# Patient Record
Sex: Female | Born: 1975 | Race: Black or African American | Hispanic: No | Marital: Married | Smoking: Never smoker
Health system: Southern US, Community
[De-identification: ages and names within clinical notes are randomized; demographics above are authoritative.]

## PROBLEM LIST (undated history)

## (undated) DIAGNOSIS — M79601 Pain in right arm: Secondary | ICD-10-CM

## (undated) DIAGNOSIS — Z8742 Personal history of other diseases of the female genital tract: Secondary | ICD-10-CM

## (undated) DIAGNOSIS — O24419 Gestational diabetes mellitus in pregnancy, unspecified control: Secondary | ICD-10-CM

## (undated) DIAGNOSIS — K219 Gastro-esophageal reflux disease without esophagitis: Secondary | ICD-10-CM

## (undated) DIAGNOSIS — R079 Chest pain, unspecified: Secondary | ICD-10-CM

## (undated) DIAGNOSIS — M7751 Other enthesopathy of right foot: Secondary | ICD-10-CM

## (undated) DIAGNOSIS — M79606 Pain in leg, unspecified: Secondary | ICD-10-CM

## (undated) DIAGNOSIS — N814 Uterovaginal prolapse, unspecified: Secondary | ICD-10-CM

## (undated) DIAGNOSIS — R5383 Other fatigue: Secondary | ICD-10-CM

## (undated) DIAGNOSIS — R9439 Abnormal result of other cardiovascular function study: Secondary | ICD-10-CM

## (undated) DIAGNOSIS — M21619 Bunion of unspecified foot: Secondary | ICD-10-CM

## (undated) HISTORY — DX: Gestational diabetes mellitus in pregnancy, unspecified control: O24.419

## (undated) HISTORY — DX: Pain in right arm: M79.601

## (undated) HISTORY — DX: Gastro-esophageal reflux disease without esophagitis: K21.9

## (undated) HISTORY — DX: Abnormal result of other cardiovascular function study: R94.39

## (undated) HISTORY — DX: Bunion of unspecified foot: M21.619

## (undated) HISTORY — PX: OTHER SURGICAL HISTORY: SHX169

## (undated) HISTORY — DX: Chest pain, unspecified: R07.9

## (undated) HISTORY — DX: Uterovaginal prolapse, unspecified: N81.4

## (undated) HISTORY — DX: Pain in leg, unspecified: M79.606

## (undated) HISTORY — DX: Other enthesopathy of right foot and ankle: M77.51

## (undated) HISTORY — DX: Personal history of other diseases of the female genital tract: Z87.42

## (undated) HISTORY — DX: Other fatigue: R53.83

---

## 1997-07-18 ENCOUNTER — Emergency Department (HOSPITAL_COMMUNITY): Admission: EM | Admit: 1997-07-18 | Discharge: 1997-07-18 | Payer: Self-pay | Admitting: Emergency Medicine

## 1998-10-01 ENCOUNTER — Emergency Department (HOSPITAL_COMMUNITY): Admission: EM | Admit: 1998-10-01 | Discharge: 1998-10-01 | Payer: Self-pay | Admitting: Emergency Medicine

## 1999-12-17 ENCOUNTER — Encounter: Payer: Self-pay | Admitting: Emergency Medicine

## 1999-12-17 ENCOUNTER — Emergency Department (HOSPITAL_COMMUNITY): Admission: EM | Admit: 1999-12-17 | Discharge: 1999-12-17 | Payer: Self-pay | Admitting: Emergency Medicine

## 2001-02-16 HISTORY — PX: DIAGNOSTIC LAPAROSCOPY: SUR761

## 2007-08-01 ENCOUNTER — Emergency Department (HOSPITAL_BASED_OUTPATIENT_CLINIC_OR_DEPARTMENT_OTHER): Admission: EM | Admit: 2007-08-01 | Discharge: 2007-08-01 | Payer: Self-pay | Admitting: Emergency Medicine

## 2008-03-21 ENCOUNTER — Emergency Department (HOSPITAL_BASED_OUTPATIENT_CLINIC_OR_DEPARTMENT_OTHER): Admission: EM | Admit: 2008-03-21 | Discharge: 2008-03-22 | Payer: Self-pay | Admitting: Emergency Medicine

## 2008-03-22 ENCOUNTER — Ambulatory Visit (HOSPITAL_COMMUNITY): Admission: RE | Admit: 2008-03-22 | Discharge: 2008-03-22 | Payer: Self-pay | Admitting: Obstetrics and Gynecology

## 2008-07-10 ENCOUNTER — Encounter: Admission: RE | Admit: 2008-07-10 | Discharge: 2008-07-10 | Payer: Self-pay | Admitting: Obstetrics and Gynecology

## 2008-10-12 ENCOUNTER — Inpatient Hospital Stay (HOSPITAL_COMMUNITY): Admission: AD | Admit: 2008-10-12 | Discharge: 2008-10-14 | Payer: Self-pay | Admitting: Obstetrics and Gynecology

## 2008-11-21 ENCOUNTER — Inpatient Hospital Stay (HOSPITAL_COMMUNITY): Admission: RE | Admit: 2008-11-21 | Discharge: 2008-11-24 | Payer: Self-pay | Admitting: Obstetrics and Gynecology

## 2010-05-22 LAB — CBC
HCT: 36.6 % (ref 36.0–46.0)
Hemoglobin: 12.2 g/dL (ref 12.0–15.0)
MCHC: 33.3 g/dL (ref 30.0–36.0)
MCHC: 33.7 g/dL (ref 30.0–36.0)
MCV: 92.4 fL (ref 78.0–100.0)
Platelets: 127 10*3/uL — ABNORMAL LOW (ref 150–400)
Platelets: 155 10*3/uL (ref 150–400)
RBC: 3.96 MIL/uL (ref 3.87–5.11)
RDW: 14.6 % (ref 11.5–15.5)
RDW: 14.8 % (ref 11.5–15.5)
WBC: 10.1 10*3/uL (ref 4.0–10.5)

## 2010-05-22 LAB — GLUCOSE, CAPILLARY: Glucose-Capillary: 77 mg/dL (ref 70–99)

## 2010-05-22 LAB — COMPREHENSIVE METABOLIC PANEL
ALT: 14 U/L (ref 0–35)
AST: 15 U/L (ref 0–37)
Albumin: 2.6 g/dL — ABNORMAL LOW (ref 3.5–5.2)
Alkaline Phosphatase: 186 U/L — ABNORMAL HIGH (ref 39–117)
BUN: 6 mg/dL (ref 6–23)
CO2: 23 mEq/L (ref 19–32)
Calcium: 8.9 mg/dL (ref 8.4–10.5)
Chloride: 107 mEq/L (ref 96–112)
Creatinine, Ser: 0.62 mg/dL (ref 0.4–1.2)
GFR calc Af Amer: >60 mL/min (ref >60)
GFR calc non Af Amer: >60 mL/min (ref >60)
Glucose, Bld: 151 mg/dL — ABNORMAL HIGH (ref 70–99)
Potassium: 3.5 mEq/L (ref 3.5–5.1)
Sodium: 137 mEq/L (ref 135–145)
Total Bilirubin: 0.4 mg/dL (ref 0.3–1.2)
Total Protein: 5.3 g/dL — ABNORMAL LOW (ref 6.0–8.3)

## 2010-05-22 LAB — BASIC METABOLIC PANEL
BUN: 7 mg/dL (ref 6–23)
Calcium: 8.3 mg/dL — ABNORMAL LOW (ref 8.4–10.5)
Creatinine, Ser: 0.7 mg/dL (ref 0.4–1.2)
GFR calc non Af Amer: >60 mL/min (ref >60)
Glucose, Bld: 80 mg/dL (ref 70–99)
Sodium: 134 mEq/L — ABNORMAL LOW (ref 135–145)

## 2010-05-22 LAB — RPR: RPR Ser Ql: NONREACTIVE

## 2010-05-24 LAB — GLUCOSE, CAPILLARY
Glucose-Capillary: 115 mg/dL — ABNORMAL HIGH (ref 70–99)
Glucose-Capillary: 116 mg/dL — ABNORMAL HIGH (ref 70–99)
Glucose-Capillary: 123 mg/dL — ABNORMAL HIGH (ref 70–99)

## 2010-06-03 LAB — DIFFERENTIAL
Basophils Absolute: 0.1 10*3/uL (ref 0.0–0.1)
Basophils Relative: 1 % (ref 0–1)
Eosinophils Absolute: 0.2 10*3/uL (ref 0.0–0.7)
Eosinophils Relative: 2 % (ref 0–5)
Lymphocytes Relative: 20 % (ref 12–46)
Monocytes Absolute: 0.7 10*3/uL (ref 0.1–1.0)

## 2010-06-03 LAB — CBC
HCT: 40.3 % (ref 36.0–46.0)
Hemoglobin: 13.6 g/dL (ref 12.0–15.0)
MCHC: 33.8 g/dL (ref 30.0–36.0)
Platelets: 200 10*3/uL (ref 150–400)
RDW: 13 % (ref 11.5–15.5)

## 2010-06-03 LAB — HCG, QUANTITATIVE, PREGNANCY: hCG, Beta Chain, Quant, S: 142 m[IU]/mL — ABNORMAL HIGH (ref <5)

## 2010-06-03 LAB — ABO/RH: ABO/RH(D): O POS

## 2010-06-03 LAB — URINALYSIS, ROUTINE W REFLEX MICROSCOPIC
Bilirubin Urine: NEGATIVE
Hgb urine dipstick: NEGATIVE
Ketones, ur: NEGATIVE mg/dL
Nitrite: NEGATIVE
Urobilinogen, UA: 1 mg/dL (ref 0.0–1.0)
pH: 6 (ref 5.0–8.0)

## 2010-06-03 LAB — BASIC METABOLIC PANEL
BUN: 18 mg/dL (ref 6–23)
CO2: 25 mEq/L (ref 19–32)
GFR calc non Af Amer: >60 mL/min (ref >60)
Glucose, Bld: 88 mg/dL (ref 70–99)
Potassium: 4.4 mEq/L (ref 3.5–5.1)
Sodium: 138 mEq/L (ref 135–145)

## 2010-06-03 LAB — PREGNANCY, URINE: Preg Test, Ur: POSITIVE

## 2010-06-03 LAB — GC/CHLAMYDIA PROBE AMP, GENITAL: Chlamydia, DNA Probe: NEGATIVE

## 2011-04-28 ENCOUNTER — Other Ambulatory Visit: Payer: Self-pay | Admitting: Urology

## 2011-05-20 ENCOUNTER — Encounter (HOSPITAL_COMMUNITY): Payer: Self-pay | Admitting: Pharmacist

## 2011-06-08 ENCOUNTER — Inpatient Hospital Stay (HOSPITAL_COMMUNITY): Admission: RE | Admit: 2011-06-08 | Payer: BC Managed Care – PPO | Source: Ambulatory Visit

## 2011-06-09 ENCOUNTER — Other Ambulatory Visit: Payer: Self-pay | Admitting: Obstetrics and Gynecology

## 2011-06-10 ENCOUNTER — Encounter (HOSPITAL_COMMUNITY)
Admission: RE | Admit: 2011-06-10 | Discharge: 2011-06-10 | Disposition: A | Discharge status: Home / Self Care | Payer: BC Managed Care – PPO | Source: Ambulatory Visit | Attending: Obstetrics and Gynecology | Admitting: Obstetrics and Gynecology

## 2011-06-10 ENCOUNTER — Encounter (HOSPITAL_COMMUNITY): Payer: Self-pay

## 2011-06-10 LAB — CBC
Hemoglobin: 11.1 g/dL — ABNORMAL LOW (ref 12.0–15.0)
Platelets: 267 10*3/uL (ref 150–400)
RBC: 4.21 MIL/uL (ref 3.87–5.11)
WBC: 6.8 10*3/uL (ref 4.0–10.5)

## 2011-06-10 LAB — APTT: aPTT: 30 seconds (ref 24–37)

## 2011-06-10 LAB — SURGICAL PCR SCREEN: Staphylococcus aureus: NEGATIVE

## 2011-06-10 NOTE — Patient Instructions (Addendum)
20 Chandell A Steinhauser  06/10/2011   Your procedure is scheduled on:  5/2  Enter through the Main Entrance of [facility] at 6 AM.  Pick up the phone at the desk and dial [phone].   Call this number if you have problems the morning of surgery: [phone]   Remember:   Do not eat food:After Midnight.  Do not drink clear liquids: After Midnight.  Take these medicines the morning of surgery with A SIP OF WATER: NA   Do not wear jewelry, make-up or nail polish.  Do not wear lotions, powders, or perfumes. You may wear deodorant.  Do not shave 48 hours prior to surgery.  Do not bring valuables to the hospital.  Contacts, dentures or bridgework may not be worn into surgery.  Leave suitcase in the car. After surgery it may be brought to your room.  For patients admitted to the hospital, checkout time is 11:00 AM the day of discharge.   Patients discharged the day of surgery will not be allowed to drive home.  Name and phone number of your driver: NA  Special Instructions: CHG Shower Use Special Wash: 1/2 bottle night before surgery and 1/2 bottle morning of surgery.   Please read over the following fact sheets that you were given: MRSA Information

## 2011-06-17 MED ORDER — GENTAMICIN SULFATE 40 MG/ML IJ SOLN
INTRAVENOUS | Status: AC
  Administered 2011-06-18: 100 mL via INTRAVENOUS
  Filled 2011-06-17: qty 2.5

## 2011-06-17 NOTE — H&P (Signed)
History of Present Illness   [name] has uterine prolapse. Her support anteriorly and posteriorly actually looked quite good. She has an urgent bladder and mild frequency and nocturia. She has endometriosis and fibroids and is planning a hysterectomy with Dr. Cherly Hensen. She can feel her dropped uterus.   She was here to discuss her urodynamics. Review of systems: No change in bowel or neurologic status.   Uroflowmetry: She voided 247 mL with a maximum flow of 45 mL per second. Pattern was normal. Residual was 7 mL.   Her maximum capacity was 325 mL. She had instability reaching pressures of 66 cm of water associated with no leakage. She did not leak generating a Valsalva pressure of 114 cm of water. During voluntary voiding she voided 446 mL with a maximum voiding pressure of 42 cm of water and she voided efficiently. EMG activity was normal. She had a modest cystocele fluoroscopically. Her bladder was trabeculated. She had a high pressure stable contraction after voiding. The details of the urodynamics are signed and dictated on the urodynamic sheet.    Past Medical History Problems  1. History of  Asthma 493.90 2. History of  Endometriosis 617.9 3. History of  Esophageal Reflux 530.81 4. History of  Gestational Diabetes Mellitus 648.80 5. History of  Uterine Prolapse 618.1  Surgical History Problems  1. History of  Cesarean Section 2. History of  Laparoscopy Small Intestine Excision Endometriotic Tissue  Current Meds 1. Bee Pollen 500 MG Oral Tablet Chewable; Therapy: (Recorded:01Nov2012) to  Allergies Medication  1. Penicillins  Family History Problems  1. Maternal history of  Bone Cancer lung, liver 2. Paternal history of  Prostate Cancer V16.42 3. Paternal grandfather's history of  Renal Failure  Social History Problems  1. Caffeine Use 2. Marital History - Currently Married 3. 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 History of  Alcohol  Use  Assessment Assessed  1. Cystocele 596.89 2. Uterovaginal Prolapse 618.4 3. Feelings Of Urinary Urgency 788.63  Discussion/Summary   [name] has uterine prolapse and a cystocele. She likely would benefit from a transvaginal hysterectomy and fixation of the cuff to the uterosacral ligaments plus or minus cystocele repair plus or minus 4-corner graft and it is most likely she would need a vault suspension. She was consented for this.   I drew her a picture and we talked about prolapse surgery in detail. Pros, cons, general surgical and anesthetic risks, and other options including behavioral therapy, pessaries, and watchful waiting were discussed. She understands that prolapse repairs are successful in 80-85% of cases for prolapse symptoms and can recur anteriorly, posteriorly, and/or apically. She understands that in most cases I use a graft and general risks were discussed. Surgical risks were described but not limited to the discussion of injury to neighboring structures including the bowel (with possible life-threatening sepsis and colostomy), bladder, urethra, vagina (all resulting in further surgery), and ureter (resulting in re-implantation). We talked about injury to nerves/soft tissue leading to debilitating and intractable pelvic, abdominal, and lower extremity pain syndromes and neuropathies. The risks of buttock pain, intractable dyspareunia, and vaginal narrowing and shortening with sequelae were discussed. Bleeding risks, transfusion rates, and infection were discussed. The risk of persistent, de novo, or worsening bladder and/or bowel incontinence/dysfunction was discussed. The need for CIC was described as well the usual post-operative course. The patient understands that she might not reach her treatment goal and that she might be worse following surgery.  Watchful waiting and pessary  were also discussed.   [name] is going to follow up with Dr. Maxie Better. We will  proceed accordingly. She was happy with our discussion today. I will await a call from Dr. Cherly Hensen to schedule her surgery.  After a thorough review of the management options for the patient's condition the patient  elected to proceed with surgical therapy as noted above. We have discussed the potential benefits and risks of the procedure, side effects of the proposed treatment, the likelihood of the patient achieving the goals of the procedure, and any potential problems that might occur during the procedure or recuperation. Informed consent has been obtained.

## 2011-06-18 ENCOUNTER — Encounter (HOSPITAL_COMMUNITY): Payer: Self-pay | Admitting: Anesthesiology

## 2011-06-18 ENCOUNTER — Encounter (HOSPITAL_COMMUNITY)
Admission: RE | Disposition: A | Discharge status: Home / Self Care | Payer: Self-pay | Source: Ambulatory Visit | Attending: Obstetrics and Gynecology

## 2011-06-18 ENCOUNTER — Ambulatory Visit (HOSPITAL_COMMUNITY)
Admission: RE | Admit: 2011-06-18 | Discharge: 2011-06-19 | Disposition: A | Discharge status: Home / Self Care | Payer: BC Managed Care – PPO | Source: Ambulatory Visit | Attending: Obstetrics and Gynecology | Admitting: Obstetrics and Gynecology

## 2011-06-18 ENCOUNTER — Encounter (HOSPITAL_COMMUNITY): Payer: Self-pay | Admitting: *Deleted

## 2011-06-18 ENCOUNTER — Ambulatory Visit (HOSPITAL_COMMUNITY): Payer: BC Managed Care – PPO | Admitting: Anesthesiology

## 2011-06-18 DIAGNOSIS — Z01818 Encounter for other preprocedural examination: Secondary | ICD-10-CM | POA: Insufficient documentation

## 2011-06-18 DIAGNOSIS — Z90710 Acquired absence of both cervix and uterus: Secondary | ICD-10-CM

## 2011-06-18 DIAGNOSIS — N815 Vaginal enterocele: Secondary | ICD-10-CM | POA: Insufficient documentation

## 2011-06-18 DIAGNOSIS — N8000 Endometriosis of the uterus, unspecified: Secondary | ICD-10-CM | POA: Insufficient documentation

## 2011-06-18 DIAGNOSIS — Z01812 Encounter for preprocedural laboratory examination: Secondary | ICD-10-CM | POA: Insufficient documentation

## 2011-06-18 DIAGNOSIS — N812 Incomplete uterovaginal prolapse: Secondary | ICD-10-CM | POA: Insufficient documentation

## 2011-06-18 DIAGNOSIS — D251 Intramural leiomyoma of uterus: Secondary | ICD-10-CM | POA: Insufficient documentation

## 2011-06-18 HISTORY — PX: VAGINAL HYSTERECTOMY: SHX2639

## 2011-06-18 HISTORY — PX: CYSTOSCOPY: SHX5120

## 2011-06-18 LAB — BASIC METABOLIC PANEL
CO2: 25 mEq/L (ref 19–32)
Chloride: 105 mEq/L (ref 96–112)
Creatinine, Ser: 0.81 mg/dL (ref 0.50–1.10)
Potassium: 4 mEq/L (ref 3.5–5.1)

## 2011-06-18 LAB — TYPE AND SCREEN: Antibody Screen: NEGATIVE

## 2011-06-18 LAB — PREGNANCY, URINE: Preg Test, Ur: NEGATIVE

## 2011-06-18 SURGERY — HYSTERECTOMY, VAGINAL
Anesthesia: General | Wound class: Clean Contaminated

## 2011-06-18 MED ORDER — MIDAZOLAM HCL 5 MG/5ML IJ SOLN
INTRAMUSCULAR | Status: DC | PRN
  Administered 2011-06-18: 2 mg via INTRAVENOUS

## 2011-06-18 MED ORDER — ZOLPIDEM TARTRATE 5 MG PO TABS: 5.0000 mg | ORAL_TABLET | Freq: Every evening | ORAL | Status: DC | PRN

## 2011-06-18 MED ORDER — FENTANYL CITRATE 0.05 MG/ML IJ SOLN
INTRAMUSCULAR | Status: AC
  Filled 2011-06-18: qty 2

## 2011-06-18 MED ORDER — HYDROMORPHONE HCL PF 1 MG/ML IJ SOLN
INTRAMUSCULAR | Status: DC | PRN
  Administered 2011-06-18 (×2): 0.5 mg via INTRAVENOUS

## 2011-06-18 MED ORDER — FENTANYL CITRATE 0.05 MG/ML IJ SOLN
INTRAMUSCULAR | Status: DC | PRN
  Administered 2011-06-18: 150 ug via INTRAVENOUS
  Administered 2011-06-18: 100 ug via INTRAVENOUS
  Administered 2011-06-18 (×2): 50 ug via INTRAVENOUS

## 2011-06-18 MED ORDER — MIDAZOLAM HCL 2 MG/2ML IJ SOLN
INTRAMUSCULAR | Status: AC
  Filled 2011-06-18: qty 2

## 2011-06-18 MED ORDER — IBUPROFEN 800 MG PO TABS: 800.0000 mg | ORAL_TABLET | Freq: Three times a day (TID) | ORAL | Status: DC | PRN

## 2011-06-18 MED ORDER — ONDANSETRON HCL 4 MG/2ML IJ SOLN
INTRAMUSCULAR | Status: DC | PRN
  Administered 2011-06-18: 4 mg via INTRAVENOUS

## 2011-06-18 MED ORDER — DIPHENHYDRAMINE HCL 50 MG/ML IJ SOLN: 12.5000 mg | Freq: Four times a day (QID) | INTRAMUSCULAR | Status: DC | PRN

## 2011-06-18 MED ORDER — ONDANSETRON HCL 4 MG PO TABS: 4.0000 mg | ORAL_TABLET | Freq: Four times a day (QID) | ORAL | Status: DC | PRN

## 2011-06-18 MED ORDER — EPHEDRINE 5 MG/ML INJ
INTRAVENOUS | Status: AC
  Filled 2011-06-18: qty 10

## 2011-06-18 MED ORDER — ROCURONIUM BROMIDE 50 MG/5ML IV SOLN
INTRAVENOUS | Status: AC
  Filled 2011-06-18: qty 1

## 2011-06-18 MED ORDER — VASOPRESSIN 20 UNIT/ML IJ SOLN
INTRAMUSCULAR | Status: AC
  Filled 2011-06-18: qty 3

## 2011-06-18 MED ORDER — INDIGOTINDISULFONATE SODIUM 8 MG/ML IJ SOLN
INTRAMUSCULAR | Status: DC | PRN
  Administered 2011-06-18: 5 mL via INTRAVENOUS

## 2011-06-18 MED ORDER — FENTANYL CITRATE 0.05 MG/ML IJ SOLN
25.0000 ug | INTRAMUSCULAR | Status: DC | PRN
  Administered 2011-06-18 (×2): 25 ug via INTRAVENOUS

## 2011-06-18 MED ORDER — GLYCOPYRROLATE 0.2 MG/ML IJ SOLN
INTRAMUSCULAR | Status: AC
  Filled 2011-06-18: qty 1

## 2011-06-18 MED ORDER — PROMETHAZINE HCL 25 MG/ML IJ SOLN: 6.2500 mg | INTRAMUSCULAR | Status: DC | PRN

## 2011-06-18 MED ORDER — GLYCOPYRROLATE 0.2 MG/ML IJ SOLN
INTRAMUSCULAR | Status: DC | PRN
  Administered 2011-06-18: 0.6 mg via INTRAVENOUS
  Administered 2011-06-18: 0.2 mg via INTRAVENOUS

## 2011-06-18 MED ORDER — INDIGOTINDISULFONATE SODIUM 8 MG/ML IJ SOLN
INTRAMUSCULAR | Status: AC
  Filled 2011-06-18: qty 5

## 2011-06-18 MED ORDER — ONDANSETRON HCL 4 MG/2ML IJ SOLN
INTRAMUSCULAR | Status: AC
  Filled 2011-06-18: qty 2

## 2011-06-18 MED ORDER — HYDROMORPHONE HCL PF 1 MG/ML IJ SOLN
INTRAMUSCULAR | Status: AC
  Filled 2011-06-18: qty 1

## 2011-06-18 MED ORDER — EPHEDRINE SULFATE 50 MG/ML IJ SOLN
INTRAMUSCULAR | Status: DC | PRN
  Administered 2011-06-18 (×2): 10 mg via INTRAVENOUS

## 2011-06-18 MED ORDER — GENTAMICIN SULFATE 40 MG/ML IJ SOLN
Freq: Three times a day (TID) | INTRAVENOUS | Status: AC
  Administered 2011-06-18 (×2): via INTRAVENOUS
  Filled 2011-06-18 (×2): qty 2.5

## 2011-06-18 MED ORDER — DEXTROSE IN LACTATED RINGERS 5 % IV SOLN
INTRAVENOUS | Status: DC
  Administered 2011-06-18 – 2011-06-19 (×3): via INTRAVENOUS

## 2011-06-18 MED ORDER — PROPOFOL 10 MG/ML IV EMUL
INTRAVENOUS | Status: DC | PRN
  Administered 2011-06-18: 200 mg via INTRAVENOUS

## 2011-06-18 MED ORDER — HYDROMORPHONE 0.3 MG/ML IV SOLN
INTRAVENOUS | Status: DC
  Administered 2011-06-18: 12:00:00 via INTRAVENOUS
  Administered 2011-06-18: 0.4 mg via INTRAVENOUS
  Administered 2011-06-19: 0.599 mg via INTRAVENOUS
  Filled 2011-06-18: qty 25

## 2011-06-18 MED ORDER — DIPHENHYDRAMINE HCL 12.5 MG/5ML PO ELIX
12.5000 mg | ORAL_SOLUTION | Freq: Four times a day (QID) | ORAL | Status: DC | PRN
  Filled 2011-06-18: qty 5

## 2011-06-18 MED ORDER — ONDANSETRON HCL 4 MG/2ML IJ SOLN: 4.0000 mg | Freq: Four times a day (QID) | INTRAMUSCULAR | Status: DC | PRN

## 2011-06-18 MED ORDER — VASOPRESSIN 20 UNIT/ML IJ SOLN
INTRAMUSCULAR | Status: DC | PRN
  Administered 2011-06-18: 20 [IU]

## 2011-06-18 MED ORDER — OXYCODONE-ACETAMINOPHEN 5-325 MG PO TABS
1.0000 | ORAL_TABLET | ORAL | Status: DC | PRN
  Administered 2011-06-19: 2 via ORAL
  Filled 2011-06-18: qty 2

## 2011-06-18 MED ORDER — LIDOCAINE-EPINEPHRINE (PF) 1 %-1:200000 IJ SOLN
INTRAMUSCULAR | Status: AC
  Filled 2011-06-18: qty 10

## 2011-06-18 MED ORDER — ROCURONIUM BROMIDE 100 MG/10ML IV SOLN
INTRAVENOUS | Status: DC | PRN
  Administered 2011-06-18 (×2): 10 mg via INTRAVENOUS
  Administered 2011-06-18: 50 mg via INTRAVENOUS

## 2011-06-18 MED ORDER — MEPERIDINE HCL 25 MG/ML IJ SOLN: 6.2500 mg | INTRAMUSCULAR | Status: DC | PRN

## 2011-06-18 MED ORDER — NALOXONE HCL 0.4 MG/ML IJ SOLN: 0.4000 mg | INTRAMUSCULAR | Status: DC | PRN

## 2011-06-18 MED ORDER — KETOROLAC TROMETHAMINE 30 MG/ML IJ SOLN: 15.0000 mg | Freq: Once | INTRAMUSCULAR | Status: DC | PRN

## 2011-06-18 MED ORDER — FENTANYL CITRATE 0.05 MG/ML IJ SOLN
INTRAMUSCULAR | Status: AC
  Filled 2011-06-18: qty 5

## 2011-06-18 MED ORDER — LACTATED RINGERS IV SOLN
INTRAVENOUS | Status: DC
  Administered 2011-06-18 (×3): via INTRAVENOUS

## 2011-06-18 MED ORDER — NEOSTIGMINE METHYLSULFATE 1 MG/ML IJ SOLN
INTRAMUSCULAR | Status: AC
  Filled 2011-06-18: qty 10

## 2011-06-18 MED ORDER — MIDAZOLAM HCL 2 MG/2ML IJ SOLN: 0.5000 mg | Freq: Once | INTRAMUSCULAR | Status: DC | PRN

## 2011-06-18 MED ORDER — ESTRADIOL 0.1 MG/GM VA CREA
TOPICAL_CREAM | VAGINAL | Status: AC
  Filled 2011-06-18: qty 42.5

## 2011-06-18 MED ORDER — PANTOPRAZOLE SODIUM 40 MG PO TBEC
40.0000 mg | DELAYED_RELEASE_TABLET | Freq: Every day | ORAL | Status: DC
  Administered 2011-06-18: 40 mg via ORAL
  Filled 2011-06-18 (×3): qty 1

## 2011-06-18 MED ORDER — SODIUM CHLORIDE 0.9 % IR SOLN
Freq: Once | Status: DC
  Filled 2011-06-18: qty 1

## 2011-06-18 MED ORDER — MENTHOL 3 MG MT LOZG: 1.0000 | LOZENGE | OROMUCOSAL | Status: DC | PRN

## 2011-06-18 MED ORDER — NEOSTIGMINE METHYLSULFATE 1 MG/ML IJ SOLN
INTRAMUSCULAR | Status: DC | PRN
  Administered 2011-06-18: 3 mg via INTRAVENOUS

## 2011-06-18 MED ORDER — SODIUM CHLORIDE 0.9 % IJ SOLN
INTRAMUSCULAR | Status: DC | PRN
  Administered 2011-06-18: 30 mL via INTRAVENOUS

## 2011-06-18 MED ORDER — ACETAMINOPHEN 325 MG PO TABS: 325.0000 mg | ORAL_TABLET | ORAL | Status: DC | PRN

## 2011-06-18 MED ORDER — LIDOCAINE HCL (CARDIAC) 20 MG/ML IV SOLN
INTRAVENOUS | Status: AC
  Filled 2011-06-18: qty 5

## 2011-06-18 MED ORDER — SODIUM CHLORIDE 0.9 % IJ SOLN: 9.0000 mL | INTRAMUSCULAR | Status: DC | PRN

## 2011-06-18 MED ORDER — SODIUM CHLORIDE 0.9 % IR SOLN: 1.0000 "application " | Freq: Once | Status: DC

## 2011-06-18 MED ORDER — LIDOCAINE HCL (CARDIAC) 20 MG/ML IV SOLN
INTRAVENOUS | Status: DC | PRN
  Administered 2011-06-18 (×2): 50 mg via INTRAVENOUS

## 2011-06-18 MED ORDER — PROPOFOL 10 MG/ML IV EMUL
INTRAVENOUS | Status: AC
  Filled 2011-06-18: qty 20

## 2011-06-18 MED ORDER — HYDROMORPHONE HCL PF 1 MG/ML IJ SOLN: 0.2000 mg | INTRAMUSCULAR | Status: DC | PRN

## 2011-06-18 MED ORDER — BUPIVACAINE HCL (PF) 0.25 % IJ SOLN
INTRAMUSCULAR | Status: AC
  Filled 2011-06-18: qty 30

## 2011-06-18 MED ORDER — ONDANSETRON HCL 4 MG/2ML IJ SOLN
4.0000 mg | Freq: Four times a day (QID) | INTRAMUSCULAR | Status: DC | PRN
  Administered 2011-06-18: 4 mg via INTRAVENOUS
  Filled 2011-06-18: qty 2

## 2011-06-18 MED ORDER — 0.9 % SODIUM CHLORIDE (POUR BTL) OPTIME
TOPICAL | Status: DC | PRN
  Administered 2011-06-18: 1000 mL

## 2011-06-18 MED ORDER — FENTANYL CITRATE 0.05 MG/ML IJ SOLN
INTRAMUSCULAR | Status: AC
  Administered 2011-06-18: 25 ug via INTRAVENOUS
  Filled 2011-06-18: qty 2

## 2011-06-18 MED ORDER — SODIUM CHLORIDE 0.9 % IR SOLN
Status: DC | PRN
  Administered 2011-06-18: 09:00:00

## 2011-06-18 MED ORDER — STERILE WATER FOR IRRIGATION IR SOLN
Status: DC | PRN
  Administered 2011-06-18: 300 mL

## 2011-06-18 SURGICAL SUPPLY — 92 items
ADH SKN CLS APL DERMABOND .7 (GAUZE/BANDAGES/DRESSINGS) ×2
APPLICATOR COTTON TIP 6IN STRL (MISCELLANEOUS) ×2 IMPLANT
BLADE SURG 15 STRL LF C SS BP (BLADE) ×2 IMPLANT
BLADE SURG 15 STRL SS (BLADE) ×3
CABLE HIGH FREQUENCY MONO STRZ (ELECTRODE) IMPLANT
CANISTER SUCTION 2500CC (MISCELLANEOUS) ×6 IMPLANT
CATH FOLEY 2WAY SLVR  5CC 16FR (CATHETERS)
CATH FOLEY 2WAY SLVR 5CC 16FR (CATHETERS) IMPLANT
CATH ROBINSON RED A/P 16FR (CATHETERS) IMPLANT
CLOTH BEACON ORANGE TIMEOUT ST (SAFETY) ×3 IMPLANT
CONT PATH 16OZ SNAP LID 3702 (MISCELLANEOUS) IMPLANT
CONTAINER PREFILL 10% NBF 60ML (FORM) IMPLANT
COVER TABLE BACK 60X90 (DRAPES) ×3 IMPLANT
DECANTER SPIKE VIAL GLASS SM (MISCELLANEOUS) IMPLANT
DERMABOND ADVANCED (GAUZE/BANDAGES/DRESSINGS) ×1
DERMABOND ADVANCED .7 DNX12 (GAUZE/BANDAGES/DRESSINGS) ×2 IMPLANT
DEVICE CAPIO SUTURING (INSTRUMENTS)
DEVICE CAPIO SUTURING OPC (INSTRUMENTS) IMPLANT
DRAIN PENROSE 1/4X12 LTX (DRAIN) ×3 IMPLANT
DRAPE HYSTEROSCOPY (DRAPE) ×3 IMPLANT
DRAPE STERI URO 9X17 APER PCH (DRAPES) ×3 IMPLANT
DRAPE UNDERBUTTOCKS STRL (DRAPE) ×3 IMPLANT
ELECT LIGASURE LONG (ELECTRODE) ×3 IMPLANT
ELECT REM PT RETURN 9FT ADLT (ELECTROSURGICAL)
ELECTRODE REM PT RTRN 9FT ADLT (ELECTROSURGICAL) IMPLANT
FORCEPS CUTTING 33CM 5MM (CUTTING FORCEPS) IMPLANT
GAUZE PACKING 2X5 YD STERILE (GAUZE/BANDAGES/DRESSINGS) IMPLANT
GAUZE SPONGE 4X4 16PLY XRAY LF (GAUZE/BANDAGES/DRESSINGS) ×6 IMPLANT
GLOVE BIO SURGEON STRL SZ 6.5 (GLOVE) ×6 IMPLANT
GLOVE BIO SURGEON STRL SZ7.5 (GLOVE) ×6 IMPLANT
GLOVE BIO SURGEON STRL SZ8 (GLOVE) ×3 IMPLANT
GLOVE BIOGEL PI IND STRL 6.5 (GLOVE) ×2 IMPLANT
GLOVE BIOGEL PI IND STRL 7.0 (GLOVE) ×4 IMPLANT
GLOVE BIOGEL PI INDICATOR 6.5 (GLOVE) ×2
GLOVE BIOGEL PI INDICATOR 7.0 (GLOVE) ×2
GLOVE ECLIPSE 6.0 STRL STRAW (GLOVE) ×1 IMPLANT
GLOVE SURG SS PI 7.0 STRL IVOR (GLOVE) ×1 IMPLANT
GLOVE SURG SS PI 7.5 STRL IVOR (GLOVE) ×1 IMPLANT
GOWN PREVENTION PLUS LG XLONG (DISPOSABLE) ×16 IMPLANT
GOWN STRL REIN XL XLG (GOWN DISPOSABLE) ×5 IMPLANT
GOWN SURGICAL LARGE (GOWNS) ×1 IMPLANT
NDL INSUFFLATION 14GA 120MM (NEEDLE) ×2 IMPLANT
NDL MAYO 6 CRC TAPER PT (NEEDLE) IMPLANT
NDL SPNL 22GX1.5 QUINCKE BK (NEEDLE) ×2 IMPLANT
NDL SPNL 22GX3.5 QUINCKE BK (NEEDLE) IMPLANT
NEEDLE HYPO 22GX1.5 SAFETY (NEEDLE) ×3 IMPLANT
NEEDLE INSUFFLATION 14GA 120MM (NEEDLE) IMPLANT
NEEDLE MAYO .5 CIRCLE (NEEDLE) IMPLANT
NEEDLE MAYO 6 CRC TAPER PT (NEEDLE) IMPLANT
NEEDLE SPNL 22GX1.5 QUINCKE BK (NEEDLE) ×3 IMPLANT
NEEDLE SPNL 22GX3.5 QUINCKE BK (NEEDLE) IMPLANT
NS IRRIG 1000ML POUR BTL (IV SOLUTION) ×5 IMPLANT
PACK LAVH (CUSTOM PROCEDURE TRAY) ×3 IMPLANT
PACK VAGINAL WOMENS (CUSTOM PROCEDURE TRAY) ×2 IMPLANT
PENCIL BUTTON HOLSTER BLD 10FT (ELECTRODE) ×3 IMPLANT
PLUG CATH AND CAP STER (CATHETERS) ×3 IMPLANT
PROTECTOR NERVE ULNAR (MISCELLANEOUS) ×3 IMPLANT
RETRACTOR STAY HOOK 5MM (MISCELLANEOUS) ×3 IMPLANT
SCISSORS LAP 5X35 DISP (ENDOMECHANICALS) IMPLANT
SET CYSTO W/LG BORE CLAMP LF (SET/KITS/TRAYS/PACK) ×3 IMPLANT
SET IRRIG TUBING LAPAROSCOPIC (IRRIGATION / IRRIGATOR) IMPLANT
SHEET LAVH (DRAPES) ×3 IMPLANT
SOLUTION ELECTROLUBE (MISCELLANEOUS) IMPLANT
STRIP CLOSURE SKIN 1/4X3 (GAUZE/BANDAGES/DRESSINGS) IMPLANT
SUT CAPIO ETHIBPND (SUTURE) IMPLANT
SUT SILK 2 0 FS (SUTURE) IMPLANT
SUT VIC AB 0 CT1 18XCR BRD8 (SUTURE) ×6 IMPLANT
SUT VIC AB 0 CT1 27 (SUTURE)
SUT VIC AB 0 CT1 27XBRD ANBCTR (SUTURE) IMPLANT
SUT VIC AB 0 CT1 36 (SUTURE) ×15 IMPLANT
SUT VIC AB 0 CT1 8-18 (SUTURE) ×6
SUT VIC AB 0 CT2 27 (SUTURE) IMPLANT
SUT VIC AB 2-0 CT1 (SUTURE) IMPLANT
SUT VIC AB 2-0 SH 27 (SUTURE)
SUT VIC AB 2-0 SH 27XBRD (SUTURE) IMPLANT
SUT VIC AB 3-0 CT1 27 (SUTURE) ×3
SUT VIC AB 3-0 CT1 TAPERPNT 27 (SUTURE) ×2 IMPLANT
SUT VIC AB 3-0 PS2 18 (SUTURE)
SUT VIC AB 3-0 PS2 18XBRD (SUTURE) IMPLANT
SUT VIC AB 3-0 SH 27 (SUTURE)
SUT VIC AB 3-0 SH 27X BRD (SUTURE) IMPLANT
SUT VICRYL 0 TIES 12 18 (SUTURE) ×3 IMPLANT
SUT VICRYL 0 UR6 27IN ABS (SUTURE) ×3 IMPLANT
SUT VICRYL 4-0 PS2 18IN ABS (SUTURE) ×3 IMPLANT
TOWEL OR 17X24 6PK STRL BLUE (TOWEL DISPOSABLE) ×13 IMPLANT
TRAY FOLEY CATH 14FR (SET/KITS/TRAYS/PACK) ×6 IMPLANT
TROCAR Z-THREAD BLADED 11X100M (TROCAR) ×2 IMPLANT
TROCAR Z-THREAD BLADED 5X100MM (TROCAR) ×4 IMPLANT
TUBING CONNECTING 10 (TUBING) ×3 IMPLANT
TUBING NON-CON 1/4 X 20 CONN (TUBING) ×3 IMPLANT
WARMER LAPAROSCOPE (MISCELLANEOUS) ×2 IMPLANT
WATER STERILE IRR 1000ML POUR (IV SOLUTION) ×5 IMPLANT

## 2011-06-18 NOTE — Anesthesia Preprocedure Evaluation (Signed)
Anesthesia Evaluation  Patient identified by MRN, date of birth, ID band Patient awake    Reviewed: Allergy & Precautions, H&P , Patient's Chart, lab work & pertinent test results, reviewed documented beta blocker date and time   History of Anesthesia Complications Negative for: history of anesthetic complications  Airway Mallampati: II TM Distance: >3 FB Neck ROM: full    Dental No notable dental hx.    Pulmonary neg pulmonary ROS, asthma ,  breath sounds clear to auscultation  Pulmonary exam normal       Cardiovascular Exercise Tolerance: Good negative cardio ROS  Rhythm:regular Rate:Normal     Neuro/Psych negative neurological ROS  negative psych ROS   GI/Hepatic negative GI ROS, Neg liver ROS,   Endo/Other  negative endocrine ROS  Renal/GU negative Renal ROS     Musculoskeletal   Abdominal   Peds  Hematology negative hematology ROS (+)   Anesthesia Other Findings   Reproductive/Obstetrics negative OB ROS                           Anesthesia Physical Anesthesia Plan  ASA: II  Anesthesia Plan: General ETT   Post-op Pain Management:    Induction:   Airway Management Planned:   Additional Equipment:   Intra-op Plan:   Post-operative Plan:   Informed Consent: I have reviewed the patients History and Physical, chart, labs and discussed the procedure including the risks, benefits and alternatives for the proposed anesthesia with the patient or authorized representative who has indicated his/her understanding and acceptance.   Dental Advisory Given  Plan Discussed with: CRNA and Surgeon  Anesthesia Plan Comments:         Anesthesia Quick Evaluation  

## 2011-06-18 NOTE — Brief Op Note (Signed)
06/18/2011  9:44 AM  PATIENT:  [name]  36 y.o. female  PRE-OPERATIVE DIAGNOSIS:  Uterovaginal Prolapse, Previous Cesarean Section  POST-OPERATIVE DIAGNOSIS:  Uterovaginal Prolapse, Previous Cesarean Section  PROCEDURE:  Procedure(s) (LRB): TOTAL  HYSTERECTOMY VAGINAL (N/A), BILATERAL SALPINGECTOMY CYSTOSCOPY ()  SURGEON:  Surgeon(s) and Role: Panel 1:    * Sabrinna Yearwood Cathie Beams, MD - Primary  Panel 2:    * Martina Sinner, MD - Primary  PHYSICIAN ASSISTANT:   ASSISTANTS: Scott McDiarmid, M.D.  ANESTHESIA:   general FINDINGS : ENLARGED uterus, nl tubes( elongated), nl ovaries EBL:  Total I/O In: 1000 [I.V.:1000] Out: 300 [Urine:200; Blood:100]  BLOOD ADMINISTERED:none  DRAINS: none   LOCAL MEDICATIONS USED:  NONE  SPECIMEN:  Source of Specimen:  uterus with cervix, both tubes  DISPOSITION OF SPECIMEN:  PATHOLOGY  COUNTS:  YES  TOURNIQUET:  * No tourniquets in log *  DICTATION: .Other Dictation: Dictation Number [id number]  PLAN OF CARE: Admit for overnight observation  PATIENT DISPOSITION:  PACU - hemodynamically stable.   Delay start of Pharmacological VTE agent (>24hrs) due to surgical blood loss or risk of bleeding: no

## 2011-06-18 NOTE — Anesthesia Procedure Notes (Signed)
Procedure Name: Intubation Date/Time: 06/18/2011 7:43 AM Performed by: Shanon Payor Pre-anesthesia Checklist: Suction available, Emergency Drugs available, Patient identified, Patient being monitored and Timeout performed Patient Re-evaluated:Patient Re-evaluated prior to inductionOxygen Delivery Method: Circle system utilized Preoxygenation: Pre-oxygenation with 100% oxygen Intubation Type: IV induction Ventilation: Mask ventilation without difficulty Laryngoscope Size: Mac and 3 Grade View: Grade II Tube type: Oral Tube size: 7.0 mm Number of attempts: 1 Airway Equipment and Method: Stylet Placement Confirmation: ETT inserted through vocal cords under direct vision,  positive ETCO2 and breath sounds checked- equal and bilateral Secured at: 22 cm Tube secured with: Tape Dental Injury: Teeth and Oropharynx as per pre-operative assessment

## 2011-06-18 NOTE — OR Nursing (Signed)
0730 Patient can not remove 2 rings from right ring finger. She verbalizes desire to have OR nurse try and removeit after she is asleep, even if in tears her skin. If it can not be removed she does not want it cut, and wants it left on, even after she was informed of the risk of a burn occurring.

## 2011-06-18 NOTE — Transfer of Care (Signed)
Immediate Anesthesia Transfer of Care Note  Patient: [name]  Procedure(s) Performed: Procedure(s) (LRB): HYSTERECTOMY VAGINAL (N/A) CYSTOSCOPY ()  Patient Location: PACU  Anesthesia Type: General  Level of Consciousness: awake, alert  and oriented  Airway & Oxygen Therapy: Patient Spontanous Breathing and Patient connected to nasal cannula oxygen  Post-op Assessment: Report given to PACU RN and Post -op Vital signs reviewed and stable  Post vital signs: Reviewed and stable  Complications: No apparent anesthesia complications

## 2011-06-18 NOTE — Anesthesia Postprocedure Evaluation (Signed)
  Anesthesia Post-op Note  Patient: [name]  Procedure(s) Performed: Procedure(s) (LRB): HYSTERECTOMY VAGINAL (N/A) CYSTOSCOPY ()  Patient Location: [facility]  Anesthesia Type: General  Level of Consciousness: awake, alert  and oriented  Airway and Oxygen Therapy: Patient Spontanous Breathing and Patient connected to nasal cannula oxygen  Post-op Pain: none  Post-op Assessment: Post-op Vital signs reviewed and Patient's Cardiovascular Status Stable  Post-op Vital Signs: Reviewed and stable  Complications: No apparent anesthesia complications

## 2011-06-18 NOTE — Anesthesia Postprocedure Evaluation (Signed)
  Anesthesia Post-op Note  Patient: [name]  Procedure(s) Performed: Procedure(s) (LRB): HYSTERECTOMY VAGINAL (N/A) CYSTOSCOPY ()  Patient Location: PACU  Anesthesia Type: General  Level of Consciousness: awake, alert  and oriented  Airway and Oxygen Therapy: Patient Spontanous Breathing  Post-op Pain: mild  Post-op Assessment: Post-op Vital signs reviewed, Patient's Cardiovascular Status Stable, Respiratory Function Stable, Patent Airway, No signs of Nausea or vomiting and Pain level controlled  Post-op Vital Signs: Reviewed and stable  Complications: No apparent anesthesia complications

## 2011-06-18 NOTE — Op Note (Signed)
Preoperative diagnosis uterine prolapse; small cystocele Postop diagnosis: Uterine prolapse; small cystocele Surgery: Cystoscopy and inspection under anesthesia Surgeon: Dr. Lorin Picket Joran Kallal Asst. Maxie Better   Ms. Bobrowski was prepped and draped in usual fashion by myself and Dr. cousins. Leg position was excellent. Preoperative laboratory tests were normal. Preoperative antibiotics were given.  The patient had significant uterine prolapse as well as a short anterior vaginal wall with a small traction cystocele and minimal to no rectocele.  Dr. cousins performed a transvaginal hysterectomy. It was obvious it in the case that the patient did not require a cystocele repair rectocele repair or sacrospinous vault suspension  Dr. cousins performed a McCall Culdoplasty and plicated ureteral sacral ligaments.  The patient underwent cystoscopy. Bladder mucosa and trigone were normal. There is no stitch foreign body or carcinoma. There was no distortion of the ureters was excellent blue jets bilaterally. Bladder was emptied and the patient was taken to recovery room with a Foley catheter

## 2011-06-18 NOTE — OR Nursing (Signed)
0730 A paper history and physical with an update by Dr. Cherly Hensen is noted on the paper chart.

## 2011-06-18 NOTE — Addendum Note (Signed)
Addendum  created 06/18/11 1739 by Shanon Payor, CRNA   Modules edited:Notes Section

## 2011-06-19 LAB — CBC
HCT: 31.4 % — ABNORMAL LOW (ref 36.0–46.0)
Hemoglobin: 9.7 g/dL — ABNORMAL LOW (ref 12.0–15.0)
MCHC: 30.9 g/dL (ref 30.0–36.0)
RBC: 3.65 MIL/uL — ABNORMAL LOW (ref 3.87–5.11)
WBC: 10.4 10*3/uL (ref 4.0–10.5)

## 2011-06-19 LAB — BASIC METABOLIC PANEL
BUN: 6 mg/dL (ref 6–23)
CO2: 27 mEq/L (ref 19–32)
Chloride: 106 mEq/L (ref 96–112)
Glucose, Bld: 116 mg/dL — ABNORMAL HIGH (ref 70–99)
Potassium: 3.6 mEq/L (ref 3.5–5.1)
Sodium: 138 mEq/L (ref 135–145)

## 2011-06-19 MED ORDER — POLYETHYLENE GLYCOL 3350 17 G PO PACK: 17.0000 g | PACK | Freq: Every day | ORAL | Status: AC

## 2011-06-19 MED ORDER — MAGNESIUM HYDROXIDE 400 MG/5ML PO SUSP: 30.0000 mL | Freq: Every day | ORAL | Status: AC

## 2011-06-19 MED ORDER — MAGNESIUM HYDROXIDE 400 MG/5ML PO SUSP
30.0000 mL | Freq: Every day | ORAL | Status: DC
  Administered 2011-06-19: 30 mL via ORAL
  Filled 2011-06-19: qty 30

## 2011-06-19 MED ORDER — IBUPROFEN 800 MG PO TABS: 800.0000 mg | ORAL_TABLET | Freq: Three times a day (TID) | ORAL | Status: AC | PRN

## 2011-06-19 MED ORDER — OXYCODONE-ACETAMINOPHEN 5-325 MG PO TABS: 1.0000 | ORAL_TABLET | ORAL | Status: AC | PRN

## 2011-06-19 MED ORDER — POLYETHYLENE GLYCOL 3350 17 G PO PACK
17.0000 g | PACK | Freq: Every day | ORAL | Status: DC
  Administered 2011-06-19: 17 g via ORAL
  Filled 2011-06-19 (×2): qty 1

## 2011-06-19 NOTE — Discharge Instructions (Signed)
Call if temperature greater than equal to 100.4, nothing per vagina for 4-6 weeks or severe nausea vomiting, increased incisional pain , drainage or redness in the incision site, no straining with bowel movements, showers no bath °

## 2011-06-19 NOTE — Op Note (Signed)
[name], [name]             ACCOUNT NO.:  [id number]  MEDICAL RECORD NO.:  [id number]  LOCATION:  9311                          FACILITY:  [facility]  PHYSICIAN:  Maxie Better, M.D.DATE OF BIRTH:  June 14, 1975  DATE OF PROCEDURE:  06/18/2011 DATE OF DISCHARGE:                              OPERATIVE REPORT   PREOPERATIVE DIAGNOSIS:  Uterovaginal prolapse.  PROCEDURES: 1. Total vaginal hysterectomy. 2. Bilateral salpingectomy. 3. McCall culdoplasty. 4. Cystoscopy (Martina Sinner, MD).  POSTOPERATIVE DIAGNOSIS:  Uterovaginal prolapse.  ANESTHESIA:  General.  SURGEON:  Maxie Better, M.D.  ASSISTANT:  Martina Sinner, MD.  PROCEDURE:  Under adequate general anesthesia, the patient was placed in a dorsal lithotomy position.  Examination under anesthesia revealed a bulky retroverted uterus.  No adnexal masses could be appreciated.  The patient was sterilely prepped and draped in usual fashion.  The bladder had an indwelling Foley catheter sterilely placed.  A weighted speculum was placed in the vagina.  Sims retractor was placed anteriorly.  The cervix which was noted to be seen at the vaginal introitus was grasped with clamps in the anterior and posterior lip and pulled.  The dilute solution of pitressin was injected circumferentially at the cervical vaginal junction.  A circumferential incision was made at that junction and using sharp dissection, the posterior cul-de-sac was then opened.  The posterior cuff was then oversewn with 0 Vicryl sutures and the weighted speculum was then inserted further into the abdomen.  Attention was then turned anteriorly.  Sharp dissection was also done.  The patient had a previous cesarean section.  Care was taken as that incision was then made. Peritoneum anteriorly was subsequently opened and the anterior cul-de- sac was entered.  The uterosacral ligaments were then bilaterally clamped, cut, and suture ligated with 0 Vicryl.   Using the LigaSure, several subsequent attachments including uterine vessels were then serially clamped, cauterized, and then cut.  The uterus was then retroverted and the utero-ovarian ligaments were then bilaterally clamped, cauterized, and cut.  At that point, both ovaries could be seen easily which were normal.  The fallopian tubes were elongated and flopping in the field, and decision was then made to remove them, the mesosalpinx was then serially clamped, cauterized, and cut until both fallopian tubes were then removed.  Bleeding was encountered after the procedure was finished.  The vaginal cuff posteriorly had additional sutures place.  Bladder wall was carefully inspected.  Bleeding site was noted and figure-of-eight sutures were also placed.  Anteriorly, the bladder trigone had some small areas of bleeders which were then carefully cauterized.  Once ultimately good hemostasis was noted, the pelvis was inspected by Dr. Sherron Monday for evidence of any other pelvic support disorder.  The patient had a small bulge in the posterior cul-de- sac with no evidence of rectocele or cystocele.  With that in mind, a McCall culdoplasty was then utilized with 0 Vicryl suture in order to close the bulge as well as closing the uterosacrals in the midline.  The vagina was closed vertically using 0 Vicryl figure-of-eight interrupted sutures.  At that point, cystoscopy was done by Dr. Sherron Monday.  Both ureters were noted to be peristalsing well.  The  procedure was then felt to be complete.  Specimens; uterus with cervix, fallopian tubes x2. Estimated blood loss is 100 mL.  Intraoperative fluid 1500 mL.  Urine output 200 mL.  Sponge and instrument counts x2 was correct. Complications none.  The patient tolerated the procedure well and was transferred to recovery room in stable condition.     Maxie Better, M.D.     Annetta/MEDQ  D:  06/18/2011  T:  06/18/2011  Job:  [id number]

## 2011-06-19 NOTE — Progress Notes (Signed)
Admission nutrition screen triggered. Patients chart reviewed and assessed  for nutritional risk. Patient is determined to be at low nutrition  risk.  

## 2011-06-19 NOTE — Progress Notes (Signed)
Pt is discharged in the care of husband. Stable.. Scanty amt of Vaginal bleeding noted on V-pad. Denies any pain or discomfort. Discharged per ambulatory.

## 2011-06-19 NOTE — Progress Notes (Signed)
Subjective: Patient reports tolerating PO and no problems voiding.   Despite 2 nurses reporting pt informed them she had passed flatus, pt denies flatus or BM. No nausea or vomiting Objective: I have reviewed patient's vital signs.  vital signs, intake and output and labs. Filed Vitals:   06/19/11 1000  BP: 105/67  Pulse: 81  Temp: 98.7 F (37.1 C)  Resp: 18   I/O last 3 completed shifts: In: 5303.6 [P.O.:450; I.V.:4853.6] Out: 4450 [Urine:4350; Blood:100] Total I/O In: -  Out: 350 [Urine:350]  Lab Results  Component Value Date   WBC 10.4 06/19/2011   HGB 9.7* 06/19/2011   HCT 31.4* 06/19/2011   MCV 86.0 06/19/2011   PLT 254 06/19/2011   Lab Results  Component Value Date   CREATININE 0.76 06/19/2011    EXAM General: alert, cooperative and no distress Resp: clear to auscultation bilaterally Cardio: regular rate and rhythm, S1, S2 normal, no murmur, click, rub or gallop GI: soft, non-tender; bowel sounds normal; no masses,  no organomegaly Extremities: extremities normal, atraumatic, no cyanosis or edema Vaginal Bleeding: none  Assessment: s/p Procedure(s):Total HYSTERECTOMY VAGINAL, bilateral salpingectomy,  CYSTOSCOPY: stable, progressing well and tolerating diet Iron deficiency anemia  Plan: Advance diet Encourage ambulation Discontinue IV fluids Discharge home. Will give MOM, Mirilax  LOS: 1 day    Atari Novick A, MD 06/19/2011 2:20 PM    06/19/2011, 2:20 PM

## 2011-06-20 ENCOUNTER — Encounter (HOSPITAL_COMMUNITY): Payer: Self-pay | Admitting: Obstetrics and Gynecology

## 2012-05-10 ENCOUNTER — Emergency Department (HOSPITAL_BASED_OUTPATIENT_CLINIC_OR_DEPARTMENT_OTHER)
Admission: EM | Admit: 2012-05-10 | Discharge: 2012-05-10 | Disposition: A | Discharge status: Home / Self Care | Payer: BC Managed Care – PPO | Attending: Emergency Medicine | Admitting: Emergency Medicine

## 2012-05-10 ENCOUNTER — Emergency Department (HOSPITAL_BASED_OUTPATIENT_CLINIC_OR_DEPARTMENT_OTHER): Payer: BC Managed Care – PPO

## 2012-05-10 ENCOUNTER — Encounter (HOSPITAL_BASED_OUTPATIENT_CLINIC_OR_DEPARTMENT_OTHER): Payer: Self-pay | Admitting: *Deleted

## 2012-05-10 DIAGNOSIS — Z3202 Encounter for pregnancy test, result negative: Secondary | ICD-10-CM | POA: Insufficient documentation

## 2012-05-10 DIAGNOSIS — M79609 Pain in unspecified limb: Secondary | ICD-10-CM | POA: Insufficient documentation

## 2012-05-10 DIAGNOSIS — Z8709 Personal history of other diseases of the respiratory system: Secondary | ICD-10-CM | POA: Insufficient documentation

## 2012-05-10 DIAGNOSIS — M62838 Other muscle spasm: Secondary | ICD-10-CM

## 2012-05-10 LAB — CBC WITH DIFFERENTIAL/PLATELET
Eosinophils Absolute: 0.2 10*3/uL (ref 0.0–0.7)
Lymphocytes Relative: 27 % (ref 12–46)
Lymphs Abs: 1.9 10*3/uL (ref 0.7–4.0)
Neutrophils Relative %: 58 % (ref 43–77)
Platelets: 211 10*3/uL (ref 150–400)
RBC: 4.29 MIL/uL (ref 3.87–5.11)
WBC: 6.9 10*3/uL (ref 4.0–10.5)

## 2012-05-10 LAB — COMPREHENSIVE METABOLIC PANEL
ALT: 16 U/L (ref 0–35)
AST: 14 U/L (ref 0–37)
Alkaline Phosphatase: 95 U/L (ref 39–117)
CO2: 25 mEq/L (ref 19–32)
GFR calc Af Amer: >90 mL/min (ref >90)
Glucose, Bld: 103 mg/dL — ABNORMAL HIGH (ref 70–99)
Potassium: 3.9 mEq/L (ref 3.5–5.1)
Sodium: 137 mEq/L (ref 135–145)
Total Protein: 6.6 g/dL (ref 6.0–8.3)

## 2012-05-10 MED ORDER — SODIUM CHLORIDE 0.9 % IV BOLUS (SEPSIS)
500.0000 mL | Freq: Once | INTRAVENOUS | Status: AC
  Administered 2012-05-10: 500 mL via INTRAVENOUS

## 2012-05-10 MED ORDER — METHOCARBAMOL 500 MG PO TABS: 500.0000 mg | ORAL_TABLET | Freq: Two times a day (BID) | ORAL | Status: DC

## 2012-05-10 MED ORDER — TRAMADOL HCL 50 MG PO TABS
50.0000 mg | ORAL_TABLET | Freq: Once | ORAL | Status: AC
  Administered 2012-05-10: 50 mg via ORAL
  Filled 2012-05-10: qty 1

## 2012-05-10 MED ORDER — METHOCARBAMOL 500 MG PO TABS
1000.0000 mg | ORAL_TABLET | Freq: Once | ORAL | Status: AC
  Administered 2012-05-10: 1000 mg via ORAL
  Filled 2012-05-10: qty 2

## 2012-05-10 MED ORDER — IOHEXOL 300 MG/ML  SOLN
80.0000 mL | Freq: Once | INTRAMUSCULAR | Status: AC | PRN
  Administered 2012-05-10: 80 mL via INTRAVENOUS

## 2012-05-10 MED ORDER — TRAMADOL HCL 50 MG PO TABS: 50.0000 mg | ORAL_TABLET | Freq: Four times a day (QID) | ORAL | Status: DC | PRN

## 2012-05-10 NOTE — ED Notes (Signed)
Patient transported to CT 

## 2012-05-10 NOTE — ED Provider Notes (Signed)
History  This chart was scribed for Cassie Pullman Smitty Cords, MD by Shari Heritage, ED Scribe. The patient was seen in room MH08/MH08. Patient's care was started at 0056.   CSN: [id number]  Arrival date & time 05/10/12  0015   First MD Initiated Contact with Patient 05/10/12 0056      Chief Complaint  Patient presents with  . Neck Pain  . Arm Pain     Patient is a 37 y.o. female presenting with shoulder pain. The history is provided by the patient. No language interpreter was used.  Shoulder Pain The current episode started more than 2 days ago. The problem occurs constantly. The problem has been gradually worsening. Pertinent negatives include no chest pain, no abdominal pain, no headaches and no shortness of breath. Exacerbated by: movement, palpation. Nothing relieves the symptoms. Treatments tried: Aleve, elevation. The treatment provided no relief.    HPI Comments: [name] is a 37 y.o. female who presents to the Emergency Department complaining of moderate, constant, dull right shoulder pain that radiates down her right arm for the past 6 days. Patient states that she has had pain in all of her extremities and joints for the past 2 weeks, but right shoulder and arm pain has gradually worsened over the past week. She denies any obvious injury or trauma to the back or arms. She says she has been taking Aleve and elevating her extremities without relief. Patient hasn't seen her PMD recently for this problem. She states that a couple of months ago, she began having tingling in hands. Her doctor found that patient had Vitamin D deficiency and low Vitamin B levels. She states that tingling improved after starting vitamin supplements. She has a medical history of asthma but hasn't had an asthma flare up for several years.    Past Medical History  Diagnosis Date  . Asthma     no inhaler  last "attack" 1999    Past Surgical History  Procedure Laterality Date  . Cesarean section  2011   . Diagnostic laparoscopy  2003  . Svd       x 1  . Vaginal hysterectomy  06/18/2011    Procedure: HYSTERECTOMY VAGINAL;  Surgeon: Serita Kyle, MD;  Location: [facility];  Service: Gynecology;  Laterality: N/A;  and bilateral salpingectomy, vault suspension  . Cystoscopy  06/18/2011    Procedure: CYSTOSCOPY;  Surgeon: Martina Sinner, MD;  Location: [facility];  Service: Urology;;    Family History  Problem Relation Age of Onset  . Anesthesia problems Other     History  Substance Use Topics  . Smoking status: Never Smoker   . Smokeless tobacco: Never Used  . Alcohol Use: No    OB History   Grav Para Term Preterm Abortions TAB SAB Ect Mult Living                  Review of Systems  Constitutional: Negative for fever and chills.  HENT: Negative for sore throat, neck pain and neck stiffness.   Eyes: Negative for visual disturbance.  Respiratory: Negative for cough and shortness of breath.   Cardiovascular: Negative for chest pain, palpitations and leg swelling.  Gastrointestinal: Negative for nausea, vomiting, abdominal pain and diarrhea.  Genitourinary: Negative for dysuria, hematuria, vaginal bleeding, vaginal discharge and difficulty urinating.  Musculoskeletal: Negative for back pain.  Skin: Negative for rash.  Neurological: Negative for headaches.  All other systems reviewed and are negative.    Allergies  Penicillins  Home Medications   Current Outpatient Rx  Name  Route  Sig  Dispense  Refill  . diphenhydrAMINE (BENADRYL) 25 MG tablet   Oral   Take 25 mg by mouth every 8 (eight) hours as needed. For allergies         . ibuprofen (ADVIL,MOTRIN) 200 MG tablet   Oral   Take 800 mg by mouth every 8 (eight) hours as needed. For pain           Triage Vitals: BP 116/81  Pulse 92  Temp(Src) 97.9 F (36.6 C) (Oral)  Resp 20  Ht 5\' 5"  (1.651 m)  Wt 213 lb (96.616 kg)  BMI 35.44 kg/m2  SpO2 100%  Physical Exam  Constitutional: She is oriented to  person, place, and time. She appears well-developed and well-nourished.  HENT:  Head: Normocephalic and atraumatic.  Mouth/Throat: Oropharynx is clear and moist and mucous membranes are normal. Mucous membranes are not dry. No oropharyngeal exudate.  Eyes: Conjunctivae and EOM are normal. Pupils are equal, round, and reactive to light.  Neck: Normal range of motion. Neck supple.  Cardiovascular: Normal rate, regular rhythm and normal heart sounds.   No murmur heard. Pulmonary/Chest: Effort normal and breath sounds normal. No respiratory distress. She has no wheezes. She has no rales.  Abdominal: Soft. Bowel sounds are normal. There is no tenderness. There is no rebound and no guarding.  Musculoskeletal: Normal range of motion. She exhibits no edema and no tenderness.  Spasm of the right trapezius. No winging of the scapula. Negative Neers test. 5/5 strength of upper and lower extremities bilaterally. Intact reflexes. Biceps tendon intact.   Neurological: She is alert and oriented to person, place, and time. She has normal reflexes.  Skin: Skin is warm and dry.  Psychiatric: She has a normal mood and affect.    ED Course  Procedures (including critical care time) DIAGNOSTIC STUDIES: Oxygen Saturation is 100% on room air, normal by my interpretation.    COORDINATION OF CARE: 1:03 AM- Patient informed of current plan for treatment and evaluation and agrees with plan at this time.    Labs Reviewed  PREGNANCY, URINE  CBC WITH DIFFERENTIAL  COMPREHENSIVE METABOLIC PANEL    Dg Chest 2 View  05/10/2012  *RADIOLOGY REPORT*  Clinical Data: Back pain.  CHEST - 2 VIEW  Comparison: None.  Findings:  Cardiopericardial silhouette within normal limits. Mediastinal contours normal. Trachea midline.  No airspace disease or effusion.  IMPRESSION: No active cardiopulmonary disease.   Original Report Authenticated By: Andreas Newport, M.D.    Dg Cervical Spine Complete  05/10/2012  *RADIOLOGY REPORT*   Clinical Data: Cervical spine pain.  Back pain.  CERVICAL SPINE - COMPLETE 4+ VIEW  Comparison: None.  Findings: Mild reversal of the normal cervical lordosis centered around C5.  This is probably positional or due to spasm.  There is no fracture or dislocation.  Craniocervical alignment appears normal.  The odontoid appears intact.  Prevertebral soft tissues are normal.  There is borderline prevertebral soft tissue thickening at C2, measuring 7 mm in thickness.  There is some obliquity on the lateral view which may account for some thickening.  IMPRESSION: No acute osseous abnormality.  Mild reversal of the normal cervical lordosis probably due to spasm.  Borderline prevertebral soft tissue thickening at C2 is nonspecific.   Original Report Authenticated By: Andreas Newport, M.D.      No diagnosis found.   Date: 05/10/2012  Rate: 80  Rhythm: normal sinus rhythm  QRS Axis: normal  Intervals: normal  ST/T Wave abnormalities: normal  Conduction Disutrbances: none  Narrative Interpretation: unremarkable      MDM  Patient with palpable spasm in trapezius.  Will treat follow up with Dr. Renae Gloss for ongoing care.  Patient verbalizes understanding and agrees to follow up  No chest pain or shortness of breath symptoms clearly msk in nature.     I personally performed the services described in this documentation, which was scribed in my presence. The recorded information has been reviewed and is accurate.     Jasmine Awe, MD 05/10/12 [phone]

## 2012-05-10 NOTE — ED Notes (Signed)
Pt c/o pain from neck down to right arm for 2 weeks. Pt denies injury, no relief from OTC meds. +PMS to extremity. Pt denies any CP or SOB.

## 2013-03-10 ENCOUNTER — Other Ambulatory Visit: Payer: Self-pay | Admitting: Family

## 2013-03-10 ENCOUNTER — Ambulatory Visit
Admission: RE | Admit: 2013-03-10 | Discharge: 2013-03-10 | Disposition: A | Discharge status: Home / Self Care | Payer: BC Managed Care – PPO | Source: Ambulatory Visit | Attending: Family | Admitting: Family

## 2013-03-10 DIAGNOSIS — R200 Anesthesia of skin: Secondary | ICD-10-CM

## 2013-03-10 DIAGNOSIS — R202 Paresthesia of skin: Principal | ICD-10-CM

## 2013-07-18 ENCOUNTER — Encounter: Payer: Self-pay | Admitting: Podiatry

## 2013-07-18 ENCOUNTER — Ambulatory Visit: Payer: Self-pay | Admitting: Podiatry

## 2013-07-18 ENCOUNTER — Ambulatory Visit (INDEPENDENT_AMBULATORY_CARE_PROVIDER_SITE_OTHER): Payer: BC Managed Care – PPO | Admitting: Podiatry

## 2013-07-18 VITALS — BP 119/72 | HR 73 | Ht 65.0 in | Wt 215.2 lb

## 2013-07-18 DIAGNOSIS — M21619 Bunion of unspecified foot: Secondary | ICD-10-CM

## 2013-07-18 DIAGNOSIS — M79606 Pain in leg, unspecified: Secondary | ICD-10-CM

## 2013-07-18 DIAGNOSIS — M7751 Other enthesopathy of right foot: Secondary | ICD-10-CM

## 2013-07-18 DIAGNOSIS — M898X9 Other specified disorders of bone, unspecified site: Secondary | ICD-10-CM

## 2013-07-18 DIAGNOSIS — M79609 Pain in unspecified limb: Secondary | ICD-10-CM

## 2013-07-18 HISTORY — DX: Bunion of unspecified foot: M21.619

## 2013-07-18 HISTORY — DX: Other enthesopathy of right foot and ankle: M77.51

## 2013-07-18 HISTORY — DX: Pain in leg, unspecified: M79.606

## 2013-07-18 NOTE — Patient Instructions (Signed)
Seen for painful bunion right foot. Consent form reviewed for [facility] bunionectomy right foot. If lateral column pain and calluses get worse, may require further procedure, like Cotton osteotomy type.

## 2013-07-18 NOTE — Progress Notes (Signed)
Subjective: 38 year old female presents complaining of pain in right foot. Was diagnosed with bone spur at bunion area 2 years ago.  Has been diagnosed with Fibromyalgia soon after she was seen here in November 2013.  Loosing weight and been feeling better.  Now having pain in bunion area right foot and occasional lateral column pain right.  Objective: Dermatologic: Plantar callus under 2nd and 3rd MPJ area bilateral, under hallux bilateral.  Neurovascular status are within normal bilateral. Orthopedic: Hallux valgus with bunion bilateral, painful right. Contracted 5th digit with deformed nail right.  Limited joint motion first MPJ bilateral. Radiographic reading show dorsal spur over the first Metatarsal head, long 2nd and 3rd Metatarsal, enlarged first Metatarsal head.   Assessment: 1. 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 2. Hallux limitus bilateral. 3. Forefoot varus. 4. Plantar callus sub 2-3 bilateral.  Plan:  Reviewed findings and available options. Patient seeking surgical option. [place] bunionectomy reviewed for right foot.  If lateral column pain and calluses continue, may require further procedure like Cotton osteotomy type.

## 2013-11-08 ENCOUNTER — Emergency Department (HOSPITAL_BASED_OUTPATIENT_CLINIC_OR_DEPARTMENT_OTHER): Payer: BC Managed Care – PPO

## 2013-11-08 ENCOUNTER — Encounter (HOSPITAL_BASED_OUTPATIENT_CLINIC_OR_DEPARTMENT_OTHER): Payer: Self-pay | Admitting: Emergency Medicine

## 2013-11-08 ENCOUNTER — Emergency Department (HOSPITAL_BASED_OUTPATIENT_CLINIC_OR_DEPARTMENT_OTHER)
Admission: EM | Admit: 2013-11-08 | Discharge: 2013-11-08 | Disposition: A | Discharge status: Home / Self Care | Payer: BC Managed Care – PPO | Attending: Emergency Medicine | Admitting: Emergency Medicine

## 2013-11-08 DIAGNOSIS — J45901 Unspecified asthma with (acute) exacerbation: Secondary | ICD-10-CM | POA: Diagnosis not present

## 2013-11-08 DIAGNOSIS — Z791 Long term (current) use of non-steroidal anti-inflammatories (NSAID): Secondary | ICD-10-CM | POA: Insufficient documentation

## 2013-11-08 DIAGNOSIS — R079 Chest pain, unspecified: Secondary | ICD-10-CM | POA: Diagnosis not present

## 2013-11-08 DIAGNOSIS — Z79899 Other long term (current) drug therapy: Secondary | ICD-10-CM | POA: Diagnosis not present

## 2013-11-08 DIAGNOSIS — Z9889 Other specified postprocedural states: Secondary | ICD-10-CM | POA: Diagnosis not present

## 2013-11-08 DIAGNOSIS — Z880 Allergy status to penicillin: Secondary | ICD-10-CM | POA: Diagnosis not present

## 2013-11-08 DIAGNOSIS — Z90710 Acquired absence of both cervix and uterus: Secondary | ICD-10-CM | POA: Insufficient documentation

## 2013-11-08 LAB — BASIC METABOLIC PANEL
Anion gap: 12 (ref 5–15)
BUN: 13 mg/dL (ref 6–23)
CO2: 25 meq/L (ref 19–32)
Calcium: 9.1 mg/dL (ref 8.4–10.5)
Chloride: 101 mEq/L (ref 96–112)
Creatinine, Ser: 0.9 mg/dL (ref 0.50–1.10)
GFR calc Af Amer: >90 mL/min (ref >90)
GFR calc non Af Amer: 79 mL/min — ABNORMAL LOW (ref >90)
GLUCOSE: 122 mg/dL — AB (ref 70–99)
POTASSIUM: 3.9 meq/L (ref 3.7–5.3)
SODIUM: 138 meq/L (ref 137–147)

## 2013-11-08 LAB — CBC WITH DIFFERENTIAL/PLATELET
Basophils Absolute: 0 10*3/uL (ref 0.0–0.1)
Basophils Relative: 0 % (ref 0–1)
Eosinophils Absolute: 0.1 10*3/uL (ref 0.0–0.7)
Eosinophils Relative: 1 % (ref 0–5)
HCT: 37.9 % (ref 36.0–46.0)
HEMOGLOBIN: 12.5 g/dL (ref 12.0–15.0)
LYMPHS ABS: 2.6 10*3/uL (ref 0.7–4.0)
LYMPHS PCT: 24 % (ref 12–46)
MCH: 29.2 pg (ref 26.0–34.0)
MCHC: 33 g/dL (ref 30.0–36.0)
MCV: 88.6 fL (ref 78.0–100.0)
MONOS PCT: 6 % (ref 3–12)
Monocytes Absolute: 0.7 10*3/uL (ref 0.1–1.0)
NEUTROS PCT: 69 % (ref 43–77)
Neutro Abs: 7.5 10*3/uL (ref 1.7–7.7)
PLATELETS: 240 10*3/uL (ref 150–400)
RBC: 4.28 MIL/uL (ref 3.87–5.11)
RDW: 12.5 % (ref 11.5–15.5)
WBC: 10.9 10*3/uL — AB (ref 4.0–10.5)

## 2013-11-08 LAB — TROPONIN I

## 2013-11-08 LAB — D-DIMER, QUANTITATIVE (NOT AT ARMC)

## 2013-11-08 MED ORDER — KETOROLAC TROMETHAMINE 30 MG/ML IJ SOLN
30.0000 mg | Freq: Once | INTRAMUSCULAR | Status: AC
  Administered 2013-11-08: 30 mg via INTRAVENOUS
  Filled 2013-11-08: qty 1

## 2013-11-08 MED ORDER — NAPROXEN 375 MG PO TABS: 375.0000 mg | ORAL_TABLET | Freq: Two times a day (BID) | ORAL | Status: DC

## 2013-11-08 NOTE — ED Notes (Signed)
Pt c/o left side chest pain and SOB  X 2 days

## 2013-11-08 NOTE — ED Provider Notes (Signed)
CSN: [id number]     Arrival date & time 11/08/13  1954 History  This chart was scribed for Malvin Johns, MD by Einar Pheasant, ED Scribe. This patient was seen in room MH11/MH11 and the patient's care was started at 10:42 PM.    Chief Complaint  Patient presents with  . Chest Pain   The history is provided by the patient. No language interpreter was used.   HPI Comments: [name] is a 38 y.o. female with a hx of asthma presents to the Emergency Department complaining of intermittent gradual onset chest pain that started 2 days ago. Pt states that the pain started as a "pulling" to the center of her chest and started to wrap around to her back. [name] states that a week ago she had some mild chest pain with associated left arm pain, which resolved after taking an Asprin. She states that the current onset of chest pain has been constant today and describes the pain as a "pressure" in nature. Pt does endorse some mild SOB especially after talking a lot. She states that the SOB is exacerbated by deep breathing and exertion. Denies any joint pain, hx of blood clots, taking birth control pills, estrogen supplements, no hx of CHF, hx of HTN, or hx of DM.  No FH of early heart dz.  Past Medical History  Diagnosis Date  . Asthma     no inhaler  last "attack" 1999   Past Surgical History  Procedure Laterality Date  . Cesarean section  2011  . Diagnostic laparoscopy  2003  . Svd       x 1  . Vaginal hysterectomy  06/18/2011    Procedure: HYSTERECTOMY VAGINAL;  Surgeon: Marvene Staff, MD;  Location: [facility];  Service: Gynecology;  Laterality: N/A;  and bilateral salpingectomy, vault suspension  . Cystoscopy  06/18/2011    Procedure: CYSTOSCOPY;  Surgeon: Reece Packer, MD;  Location: [facility];  Service: Urology;;   Family History  Problem Relation Age of Onset  . Anesthesia problems Other    History  Substance Use Topics  . Smoking status: Never Smoker   . Smokeless  tobacco: Never Used  . Alcohol Use: No   OB History   Grav Para Term Preterm Abortions TAB SAB Ect Mult Living                 Review of Systems  Constitutional: Negative for fever, chills, diaphoresis and fatigue.  HENT: Negative for congestion, rhinorrhea and sneezing.   Eyes: Negative.   Respiratory: Positive for chest tightness and shortness of breath. Negative for cough.   Cardiovascular: Positive for chest pain. Negative for leg swelling.  Gastrointestinal: Negative for nausea, vomiting, abdominal pain, diarrhea and blood in stool.  Genitourinary: Negative for frequency, hematuria, flank pain and difficulty urinating.  Musculoskeletal: Negative for arthralgias and back pain.  Skin: Negative for rash.  Neurological: Negative for dizziness, speech difficulty, weakness, numbness and headaches.     Allergies  Penicillins  Home Medications   Prior to Admission medications   Medication Sig Start Date End Date Taking? Authorizing Provider  DULoxetine (CYMBALTA) 20 MG capsule Take 20 mg by mouth as needed.    Historical Provider, MD  ibuprofen (ADVIL,MOTRIN) 200 MG tablet Take 800 mg by mouth every 8 (eight) hours as needed. For pain    Historical Provider, MD  naproxen (NAPROSYN) 375 MG tablet Take 1 tablet (375 mg total) by mouth 2 (two) times daily. 11/08/13  Malvin Johns, MD  Vitamin D, Ergocalciferol, (DRISDOL) 50000 UNITS CAPS capsule Take 50,000 Units by mouth 2 (two) times a week.  07/07/13   Historical Provider, MD   Triage Vitals:BP 118/65  Pulse 86  Temp(Src) 98.2 F (36.8 C) (Oral)  Resp 20  SpO2 98%  LMP 06/03/2011  Physical Exam  Nursing note and vitals reviewed. Constitutional: She is oriented to person, place, and time. She appears well-developed and well-nourished.  HENT:  Head: Normocephalic and atraumatic.  Eyes: Pupils are equal, round, and reactive to light.  Neck: Normal range of motion. Neck supple.  Cardiovascular: Normal rate, regular rhythm and  normal heart sounds.   Pulmonary/Chest: Effort normal and breath sounds normal. No respiratory distress. She has no wheezes. She has no rales. She exhibits tenderness (+tenderness to sternum, left chest wall, left upper back).  Abdominal: Soft. Bowel sounds are normal. There is no tenderness. There is no rebound and no guarding.  Musculoskeletal: Normal range of motion. She exhibits no edema.  No calf tenderness  Lymphadenopathy:    She has no cervical adenopathy.  Neurological: She is alert and oriented to person, place, and time.  Skin: Skin is warm and dry. No rash noted.  Psychiatric: She has a normal mood and affect.     ED Course  Procedures (including critical care time)  DIAGNOSTIC STUDIES: Oxygen Saturation is 98% on RA, normal by my interpretation.    COORDINATION OF CARE: 10:51 PM- Pt was advised of the results of her EKG and Troponin. Will order a D-dimer. Advised pt that there was no sign of pneumonia secondary to the CXR. Advised pt that she will need to follow up with her PCP provided that her D-dimer test is negative. Pt states that currently she does not have a PCP, suggested to the pt that I could give her a referral to the on call cardiologist. Assured pt that she has a low score for heart disease. Pt is requesting a 1 day work note. Will write the notes as per pt request. Pt advised of plan for treatment and pt agrees.  Results for orders placed during the hospital encounter of 11/08/13  CBC WITH DIFFERENTIAL      Result Value Ref Range   WBC 10.9 (*) 4.0 - 10.5 K/uL   RBC 4.28  3.87 - 5.11 MIL/uL   Hemoglobin 12.5  12.0 - 15.0 g/dL   HCT 37.9  36.0 - 46.0 %   MCV 88.6  78.0 - 100.0 fL   MCH 29.2  26.0 - 34.0 pg   MCHC 33.0  30.0 - 36.0 g/dL   RDW 12.5  11.5 - 15.5 %   Platelets 240  150 - 400 K/uL   Neutrophils Relative % 69  43 - 77 %   Neutro Abs 7.5  1.7 - 7.7 K/uL   Lymphocytes Relative 24  12 - 46 %   Lymphs Abs 2.6  0.7 - 4.0 K/uL   Monocytes Relative  6  3 - 12 %   Monocytes Absolute 0.7  0.1 - 1.0 K/uL   Eosinophils Relative 1  0 - 5 %   Eosinophils Absolute 0.1  0.0 - 0.7 K/uL   Basophils Relative 0  0 - 1 %   Basophils Absolute 0.0  0.0 - 0.1 K/uL  BASIC METABOLIC PANEL      Result Value Ref Range   Sodium 138  137 - 147 mEq/L   Potassium 3.9  3.7 - 5.3 mEq/L   Chloride  101  96 - 112 mEq/L   CO2 25  19 - 32 mEq/L   Glucose, Bld 122 (*) 70 - 99 mg/dL   BUN 13  6 - 23 mg/dL   Creatinine, Ser 0.90  0.50 - 1.10 mg/dL   Calcium 9.1  8.4 - 10.5 mg/dL   GFR calc non Af Amer 79 (*) >90 mL/min   GFR calc Af Amer >90  >90 mL/min   Anion gap 12  5 - 15  TROPONIN I      Result Value Ref Range   Troponin I <0.30  <0.30 ng/mL  D-DIMER, QUANTITATIVE      Result Value Ref Range   D-Dimer, Quant <0.27  0.00 - 0.48 ug/mL-FEU   Dg Chest 2 View  11/08/2013   CLINICAL DATA:  38 year old female with chest pain and heaviness. Shortness of breath for 2 days.  EXAM: CHEST - 2 VIEW  COMPARISON:  05/10/2012  FINDINGS: Cardiomediastinal silhouette within normal limits in size and contour. No evidence of pulmonary vascular congestion.  No confluent airspace disease.  No pneumothorax or pleural effusion.  No displaced fracture.  Unremarkable appearance of the upper abdomen.  IMPRESSION: No radiographic evidence of acute cardiopulmonary disease  Signed,  Dulcy Fanny. Earleen Newport, DO  [facility]  [facility]   Electronically Signed   By: Corrie Mckusick O.D.   On: 11/08/2013 21:24   EKG Interpretation  Date/Time:  Wednesday November 08 2013 20:04:13 EDT Ventricular Rate:  90 PR Interval:  136 QRS Duration: 80 QT Interval:  356 QTC Calculation: 435 R Axis:   53 Text Interpretation:  Normal sinus rhythm Anterior infarct , age undetermined Abnormal ECG since last tracing no significant change Confirmed by Davontae Prusinski  MD, Keidan Aumiller ([id number]) on 11/08/2013 11:36:10 PM  MDM   Final diagnoses:  Chest pain, unspecified chest pain  type    Patient presents with chest pain. The pain has been constant for 2 days. Her troponin is negative and she has no EKG findings for ischemia. Her chest x-ray is negative with no signs of pulmonary edema or pneumonia. She had some pleuritic component to the pain although her d-dimer is negative. Chest normal oxygen saturations and no persistent tachycardia. Her pain is reproducible on palpation and I feel that it is likely musculoskeletal. She has a low heart score of 2. Given this I feel comfortable with her being discharged. I gave her a referral to followup with cardiology. I advised her to return here if she has any worsening symptoms.  I personally performed the services described in this documentation, which was scribed in my presence. The recorded information has been reviewed and is accurate.    Malvin Johns, MD 11/08/13 [phone]

## 2013-11-08 NOTE — Discharge Instructions (Signed)

## 2013-11-10 ENCOUNTER — Other Ambulatory Visit: Payer: Self-pay

## 2013-11-10 DIAGNOSIS — Z1231 Encounter for screening mammogram for malignant neoplasm of breast: Secondary | ICD-10-CM

## 2013-11-16 ENCOUNTER — Ambulatory Visit: Payer: BC Managed Care – PPO

## 2013-12-08 ENCOUNTER — Encounter: Payer: Self-pay | Admitting: *Deleted

## 2013-12-11 ENCOUNTER — Encounter: Payer: Self-pay | Admitting: Cardiology

## 2013-12-11 ENCOUNTER — Ambulatory Visit (INDEPENDENT_AMBULATORY_CARE_PROVIDER_SITE_OTHER): Payer: BC Managed Care – PPO | Admitting: Cardiology

## 2013-12-11 VITALS — BP 112/80 | HR 80 | Ht 65.0 in | Wt 225.0 lb

## 2013-12-11 DIAGNOSIS — R079 Chest pain, unspecified: Secondary | ICD-10-CM | POA: Insufficient documentation

## 2013-12-11 DIAGNOSIS — R9439 Abnormal result of other cardiovascular function study: Secondary | ICD-10-CM

## 2013-12-11 DIAGNOSIS — R5383 Other fatigue: Secondary | ICD-10-CM

## 2013-12-11 DIAGNOSIS — R072 Precordial pain: Secondary | ICD-10-CM

## 2013-12-11 DIAGNOSIS — R5382 Chronic fatigue, unspecified: Secondary | ICD-10-CM

## 2013-12-11 HISTORY — DX: Chest pain, unspecified: R07.9

## 2013-12-11 HISTORY — DX: Other fatigue: R53.83

## 2013-12-11 LAB — TSH: TSH: 0.38 u[IU]/mL (ref 0.35–4.50)

## 2013-12-11 NOTE — Progress Notes (Signed)
Patient ID: [name], female   DOB: 11/16/1975, 38 y.o.   MRN: [id number]     Patient Name: [name] Date of Encounter: 12/11/2013  Primary Care Provider:  Salena Saner., MD Primary Cardiologist:  [name]   Problem List   Past Medical History  Diagnosis Date  . Asthma     no inhaler  last "attack" 1999  . History of endometriosis   . GERD (gastroesophageal reflux disease)   . Gestational diabetes mellitus   . Uterine prolapse    Past Surgical History  Procedure Laterality Date  . Cesarean section  2011  . Diagnostic laparoscopy  2003  . Svd       x 1  . Vaginal hysterectomy  06/18/2011    Procedure: HYSTERECTOMY VAGINAL;  Surgeon: Marvene Staff, MD;  Location: [facility];  Service: Gynecology;  Laterality: N/A;  and bilateral salpingectomy, vault suspension  . Cystoscopy  06/18/2011    Procedure: CYSTOSCOPY;  Surgeon: Reece Packer, MD;  Location: [facility];  Service: Urology;;    Allergies  Allergies  Allergen Reactions  . Penicillins Other (See Comments)    Childhood reaction. Has tolerated Amoxicillin    HPI  Marvetta A Lindenberger is a 38 y.o. female with a hx of asthma (last flare up in 1999) who presented to the Emergency Department complaining of intermittent gradual onset chest pain on 11/08/2013. Pt states that the pain started as a "pulling" to the center of her chest and started to wrap around to her back. Mrs. Waites states that a week ago she had some mild chest pain with associated left arm pain, which resolved after taking an Asprin. She states that the current onset of chest pain has been constant today and describes the pain as a "pressure" in nature. Pt does endorse some mild SOB especially after talking a lot. She states that the SOB is exacerbated by deep breathing and exertion. Denies any joint pain, hx of blood clots, taking birth control pills, estrogen supplements, no hx of CHF, hx of HTN, or hx of DM. No FH of early  heart dz.  The patient was seen by a new PCP who thinks this is a costochondritis and prescribed ibuprofen, prednisone taper. She still continues to have pain, maybe slightly easier. The pain is present almost all the time, can be worsening on palpation but also on exertion. No palpitations or syncope.   Home Medications  Prior to Admission medications   Medication Sig Start Date End Date Taking? Authorizing Provider  DULoxetine (CYMBALTA) 20 MG capsule Take 20 mg by mouth as needed.   Yes Historical Provider, MD  ibuprofen (ADVIL,MOTRIN) 200 MG tablet Take 800 mg by mouth every 8 (eight) hours as needed. For pain   Yes Historical Provider, MD  Ibuprofen-Famotidine (DUEXIS) 800-26.6 MG TABS Take by mouth as directed.   Yes Historical Provider, MD  LORazepam (ATIVAN) 1 MG tablet Take 1 mg by mouth as directed.   Yes Historical Provider, MD  naproxen (NAPROSYN) 375 MG tablet Take 1 tablet (375 mg total) by mouth 2 (two) times daily. 11/08/13  Yes Malvin Johns, MD  predniSONE (DELTASONE) 10 MG tablet Taper dose. 12/04/13  Yes Historical Provider, MD  Vitamin D, Ergocalciferol, (DRISDOL) 50000 UNITS CAPS capsule Take 50,000 Units by mouth 2 (two) times a week.  07/07/13  Yes Historical Provider, MD    Family History  Family History  Problem Relation Age of Onset  . Asthma Other   .  Liver cancer    . Prostate cancer    . Lung cancer    . Bone cancer    . Kidney failure      Social History  History   Social History  . Marital Status: Married    Spouse Name: N/A    Number of Children: N/A  . Years of Education: N/A   Occupational History  . Not on file.   Social History Main Topics  . Smoking status: Never Smoker   . Smokeless tobacco: Never Used  . Alcohol Use: No  . Drug Use: No  . Sexual Activity: Yes    Birth Control/ Protection: None, Surgical   Other Topics Concern  . Not on file   Social History Narrative  . No narrative on file     Review of Systems, as per  HPI, otherwise negative General:  No chills, fever, night sweats or weight changes.  Cardiovascular:  No chest pain, dyspnea on exertion, edema, orthopnea, palpitations, paroxysmal nocturnal dyspnea. Dermatological: No rash, lesions/masses Respiratory: No cough, dyspnea Urologic: No hematuria, dysuria Abdominal:   No nausea, vomiting, diarrhea, bright red blood per rectum, melena, or hematemesis Neurologic:  No visual changes, wkns, changes in mental status. All other systems reviewed and are otherwise negative except as noted above.  Physical Exam  Blood pressure 112/80, pulse 80, height 5\' 5"  (1.651 m), weight 225 lb (102.059 kg), last menstrual period 06/03/2011, SpO2 98.00%.  General: Pleasant, NAD Psych: Normal affect. Neuro: Alert and oriented X 3. Moves all extremities spontaneously. HEENT: Normal  Neck: Supple without bruits or JVD. Lungs:  Resp regular and unlabored, CTA. Heart: RRR no s3, s4, or murmurs. Abdomen: Soft, non-tender, non-distended, BS + x 4.  Extremities: No clubbing, cyanosis or edema. DP/PT/Radials 2+ and equal bilaterally.  Labs:  No results found for this basename: CKTOTAL, CKMB, TROPONINI,  in the last 72 hours Lab Results  Component Value Date   WBC 10.9* 11/08/2013   HGB 12.5 11/08/2013   HCT 37.9 11/08/2013   MCV 88.6 11/08/2013   PLT 240 11/08/2013    Lab Results  Component Value Date   DDIMER <0.27 11/08/2013   No components found with this basename: POCBNP,     Component Value Date/Time   NA 138 11/08/2013 2030   K 3.9 11/08/2013 2030   CL 101 11/08/2013 2030   CO2 25 11/08/2013 2030   GLUCOSE 122* 11/08/2013 2030   BUN 13 11/08/2013 2030   CREATININE 0.90 11/08/2013 2030   CALCIUM 9.1 11/08/2013 2030   PROT 6.6 05/10/2012 0221   ALBUMIN 3.4* 05/10/2012 0221   AST 14 05/10/2012 0221   ALT 16 05/10/2012 0221   ALKPHOS 95 05/10/2012 0221   BILITOT 0.2* 05/10/2012 0221   GFRNONAA 79* 11/08/2013 2030   GFRAA >90 11/08/2013 2030   No results found for  this basename: CHOL, HDL, LDLCALC, TRIG    Accessory Clinical Findings  Echocardiogram - none  ECG - SR, Q in V1-2    Assessment & Plan  38 year old female   1. Chest pain with some typical and some atypical features, I agree with her PCP that this seems like a costochondritis, however exertional component, pressure like and constant fatigue are concerning for ischemia. We will oredr exercise nuclear stress test (she is post hysterectomy).  2. Fatigue - we will order TSH   3. BP - controlled  Follow up in 1 month.    [name], MD, [facility] 12/11/2013, 9:12 AM

## 2013-12-11 NOTE — Patient Instructions (Signed)
Your physician recommends that you continue on your current medications as directed. Please refer to the Current Medication list given to you today.   Your physician recommends that you return for lab work in: TODAY [facility])   Your physician has requested that you have en exercise stress myoview. For further information please visit [email or web address]. Please follow instruction sheet, as given.    Your physician recommends that you schedule a follow-up appointment in: [place]

## 2013-12-18 ENCOUNTER — Ambulatory Visit (HOSPITAL_COMMUNITY): Payer: BC Managed Care – PPO | Attending: Cardiology | Admitting: Radiology

## 2013-12-18 DIAGNOSIS — R5383 Other fatigue: Secondary | ICD-10-CM | POA: Insufficient documentation

## 2013-12-18 DIAGNOSIS — R079 Chest pain, unspecified: Secondary | ICD-10-CM | POA: Diagnosis not present

## 2013-12-18 DIAGNOSIS — R002 Palpitations: Secondary | ICD-10-CM | POA: Insufficient documentation

## 2013-12-18 DIAGNOSIS — R5382 Chronic fatigue, unspecified: Secondary | ICD-10-CM

## 2013-12-18 DIAGNOSIS — R0602 Shortness of breath: Secondary | ICD-10-CM | POA: Diagnosis not present

## 2013-12-18 MED ORDER — TECHNETIUM TC 99M SESTAMIBI GENERIC - CARDIOLITE
33.0000 | Freq: Once | INTRAVENOUS | Status: AC | PRN
  Administered 2013-12-18: 33 via INTRAVENOUS

## 2013-12-18 MED ORDER — TECHNETIUM TC 99M SESTAMIBI GENERIC - CARDIOLITE
11.0000 | Freq: Once | INTRAVENOUS | Status: AC | PRN
  Administered 2013-12-18: 11 via INTRAVENOUS

## 2013-12-18 NOTE — Progress Notes (Signed)
[place] [facility] [address] [place], [place] [address] [phone]    Cardiology Nuclear Med Study  [name] is a 38 y.o. female     MRN : [id number]     DOB: July 23, 1975  Procedure Date: 12/18/2013  Nuclear Med Background Indication for Stress Test:  Evaluation for Ischemia History:  Asthma Cardiac Risk Factors: none  Symptoms:  Chest Pain (last date of chest discomfort was yesterday), Fatigue, Palpitations and SOB   Nuclear Pre-Procedure Caffeine/Decaff Intake:  None NPO After: 7:30pm   Lungs:  clear O2 Sat: 97% on room air. IV 0.9% NS with Angio Cath:  22g  IV Site: R Hand  IV Started by:  Matilde Haymaker, RN  Chest Size (in):  38 Cup Size: C  Height: 5\' 4"  (1.626 m)  Weight:  221 lb (100.245 kg)  BMI:  Body mass index is 37.92 kg/(m^2). Tech Comments:  n/a    Nuclear Med Study 1 or 2 day study: 1 day  Stress Test Type:  Stress  Reading MD: n/a  Order Authorizing Provider:  Filiberto Pinks  Resting Radionuclide: Technetium 4m Sestamibi  Resting Radionuclide Dose: 11.0 mCi   Stress Radionuclide:  Technetium 70m Sestamibi  Stress Radionuclide Dose: 33.0 mCi           Stress Protocol Rest HR: 71 Stress HR: 162  Rest BP: 119/68 Stress BP: 159/88  Exercise Time (min): 8:00 METS: 10.1           Dose of Adenosine (mg):  n/a Dose of Lexiscan: n/a mg  Dose of Atropine (mg): n/a Dose of Dobutamine: n/a mcg/kg/min (at max HR)  Stress Test Technologist: Glade Lloyd, BS-ES  Nuclear Technologist:  Earl Many, CNMT     Rest Procedure:  Myocardial perfusion imaging was performed at rest 45 minutes following the intravenous administration of Technetium 80m Sestamibi. Rest ECG: NSR - Normal EKG  Stress Procedure:  The patient exercised on the treadmill utilizing the Bruce Protocol for 8:00 minutes. The patient stopped due to fatigue and had some right arm discomfort that resolved and denied any chest pain.  Technetium 65m  Sestamibi was injected at peak exercise and myocardial perfusion imaging was performed after a brief delay. Stress ECG: No significant change from baseline ECG  QPS Raw Data Images:  There is a breast shadow that accounts for the anterior attenuation. Stress Images:  There is a moderate size, moderate intensity defect in the mid-apical segments of the anterior wall, sparing the apex and septum Rest Images:  Improved perfusion in the anterior wall Subtraction (SDS):  These findings are consistent with ischemia. Transient Ischemic Dilatation (Normal <1.22):  0.90 Lung/Heart Ratio (Normal <0.45):  0.25  Quantitative Gated Spect Images QGS EDV:  89 ml QGS ESV:  29 ml  Impression Exercise Capacity:  Fair exercise capacity. BP Response:  Normal blood pressure response. Clinical Symptoms:  No significant symptoms noted. ECG Impression:  No significant ST segment change suggestive of ischemia. Comparison with Prior Nuclear Study: No previous nuclear study performed  Overall Impression:  Intermediate risk stress nuclear study with a medium severity mid anterior wall defect, most consistent with ischemia in a diagonal artery distribution.[name] fully exclude shifting breast attenuation artifact,  LV Ejection Fraction: 67%.  LV Wall Motion:  NL LV Function; NL Wall Motion   Sanda Klein, MD, [facility] [phone] office [phone] pager

## 2013-12-20 ENCOUNTER — Encounter: Payer: Self-pay | Admitting: *Deleted

## 2013-12-20 ENCOUNTER — Telehealth: Payer: Self-pay | Admitting: *Deleted

## 2013-12-20 DIAGNOSIS — Z01812 Encounter for preprocedural laboratory examination: Secondary | ICD-10-CM

## 2013-12-20 MED ORDER — ASPIRIN EC 81 MG PO TBEC: 81.0000 mg | DELAYED_RELEASE_TABLET | Freq: Every day | ORAL | Status: AC

## 2013-12-20 NOTE — Telephone Encounter (Signed)
Follow up          Pt calling back for [facility]

## 2013-12-20 NOTE — Telephone Encounter (Signed)
Contacted the pt to inform her that per Dr Meda Coffee her stress test was abnormal, and she recommends the pt have a left cardiac cath done.  Informed the pt that she will need to have pre procedure labs done as well, per Dr Meda Coffee. PT/INR, BMP, CBC W DIFF. Pt states she can come in for the lab appt on 12/21/13 at our office.  Also instructed the pt that per Dr Meda Coffee she recommends the pt start taking ASA 81 mg po daily starting now and definitely prior to cath.  Pt verbalized understanding, and states she can come for the labs tomorrow, but needs to call her Husband to ask which day next week would work for the pt to have cath done.  Pt states she will call back this afternoon with the date that the cath can be scheduled for.

## 2013-12-20 NOTE — Telephone Encounter (Signed)
Pt calling back to have her left sided cath scheduled for this Friday 12/22/13. Called the hospital and had this scheduled for 12/22/13 at 0730 with Dr Ellyn Hack at [facility].  Instructed the pt that when she comes in for lab work tomorrow 11/5, there will be an instruction letter available for her to pick-up at the front desk.  Advised the pt to start taking that ASA 81 mg today.  Went over cath instructions verbally with the pt as well.  Will send Dr Meda Coffee a message to place hospital orders in.  Pt verbalized understanding and agrees with this plan.

## 2013-12-20 NOTE — Telephone Encounter (Signed)
-----   Message from [name], MD sent at 12/19/2013  2:15 PM EST ----- Abnormal stress test, she will need a left cardiac catheterization, thank you

## 2013-12-20 NOTE — Telephone Encounter (Signed)
Placed labs stat for 12/21/13 to check BMP, PT/INR, and CBC W DIFF

## 2013-12-21 ENCOUNTER — Other Ambulatory Visit: Payer: BC Managed Care – PPO

## 2013-12-21 ENCOUNTER — Other Ambulatory Visit (INDEPENDENT_AMBULATORY_CARE_PROVIDER_SITE_OTHER): Payer: BC Managed Care – PPO

## 2013-12-21 DIAGNOSIS — R9439 Abnormal result of other cardiovascular function study: Secondary | ICD-10-CM

## 2013-12-21 DIAGNOSIS — Z01812 Encounter for preprocedural laboratory examination: Secondary | ICD-10-CM

## 2013-12-21 HISTORY — DX: Abnormal result of other cardiovascular function study: R94.39

## 2013-12-21 LAB — CBC WITH DIFFERENTIAL/PLATELET
Basophils Absolute: 0.1 10*3/uL (ref 0.0–0.1)
Basophils Relative: 0.6 % (ref 0.0–3.0)
Eosinophils Absolute: 0.2 10*3/uL (ref 0.0–0.7)
Eosinophils Relative: 2.5 % (ref 0.0–5.0)
HCT: 37.8 % (ref 36.0–46.0)
Hemoglobin: 12.3 g/dL (ref 12.0–15.0)
Lymphocytes Relative: 24.4 % (ref 12.0–46.0)
Lymphs Abs: 2.3 10*3/uL (ref 0.7–4.0)
MCHC: 32.4 g/dL (ref 30.0–36.0)
MCV: 88.6 fl (ref 78.0–100.0)
Monocytes Absolute: 0.7 10*3/uL (ref 0.1–1.0)
Monocytes Relative: 7.7 % (ref 3.0–12.0)
Neutro Abs: 6.1 10*3/uL (ref 1.4–7.7)
Neutrophils Relative %: 64.8 % (ref 43.0–77.0)
Platelets: 242 10*3/uL (ref 150.0–400.0)
RBC: 4.27 Mil/uL (ref 3.87–5.11)
RDW: 13.2 % (ref 11.5–15.5)
WBC: 9.5 10*3/uL (ref 4.0–10.5)

## 2013-12-21 LAB — BASIC METABOLIC PANEL
BUN: 15 mg/dL (ref 6–23)
CO2: 28 mEq/L (ref 19–32)
Calcium: 8.7 mg/dL (ref 8.4–10.5)
Chloride: 106 mEq/L (ref 96–112)
Creatinine, Ser: 0.9 mg/dL (ref 0.4–1.2)
GFR: 93.74 mL/min (ref >60.00)
Glucose, Bld: 86 mg/dL (ref 70–99)
Potassium: 3.9 mEq/L (ref 3.5–5.1)
Sodium: 140 mEq/L (ref 135–145)

## 2013-12-21 LAB — PROTIME-INR
INR: 1.1 ratio — ABNORMAL HIGH (ref 0.8–1.0)
Prothrombin Time: 11.9 s (ref 9.6–13.1)

## 2013-12-21 NOTE — Addendum Note (Signed)
Addended by: [name] on: 12/21/2013 09:52 AM   Modules accepted: Orders

## 2013-12-22 ENCOUNTER — Ambulatory Visit (HOSPITAL_COMMUNITY)
Admission: RE | Admit: 2013-12-22 | Discharge: 2013-12-22 | Disposition: A | Discharge status: Home / Self Care | Payer: BC Managed Care – PPO | Source: Ambulatory Visit | Attending: Cardiology | Admitting: Cardiology

## 2013-12-22 ENCOUNTER — Encounter (HOSPITAL_COMMUNITY)
Admission: RE | Disposition: A | Discharge status: Home / Self Care | Payer: Self-pay | Source: Ambulatory Visit | Attending: Cardiology

## 2013-12-22 DIAGNOSIS — E669 Obesity, unspecified: Secondary | ICD-10-CM | POA: Insufficient documentation

## 2013-12-22 DIAGNOSIS — R5383 Other fatigue: Secondary | ICD-10-CM | POA: Insufficient documentation

## 2013-12-22 DIAGNOSIS — R0602 Shortness of breath: Secondary | ICD-10-CM | POA: Insufficient documentation

## 2013-12-22 DIAGNOSIS — Z79899 Other long term (current) drug therapy: Secondary | ICD-10-CM | POA: Insufficient documentation

## 2013-12-22 DIAGNOSIS — Z6837 Body mass index (BMI) 37.0-37.9, adult: Secondary | ICD-10-CM | POA: Insufficient documentation

## 2013-12-22 DIAGNOSIS — R9439 Abnormal result of other cardiovascular function study: Secondary | ICD-10-CM | POA: Diagnosis present

## 2013-12-22 DIAGNOSIS — R079 Chest pain, unspecified: Secondary | ICD-10-CM | POA: Diagnosis not present

## 2013-12-22 HISTORY — PX: LEFT HEART CATHETERIZATION WITH CORONARY ANGIOGRAM: SHX5451

## 2013-12-22 SURGERY — LEFT HEART CATHETERIZATION WITH CORONARY ANGIOGRAM
Anesthesia: LOCAL

## 2013-12-22 MED ORDER — NITROGLYCERIN 1 MG/10 ML FOR IR/CATH LAB
INTRA_ARTERIAL | Status: AC
  Filled 2013-12-22: qty 10

## 2013-12-22 MED ORDER — ONDANSETRON HCL 4 MG/2ML IJ SOLN: 4.0000 mg | Freq: Four times a day (QID) | INTRAMUSCULAR | Status: DC | PRN

## 2013-12-22 MED ORDER — SODIUM CHLORIDE 0.9 % IJ SOLN: 3.0000 mL | INTRAMUSCULAR | Status: DC | PRN

## 2013-12-22 MED ORDER — SODIUM CHLORIDE 0.9 % IV SOLN: 250.0000 mL | INTRAVENOUS | Status: DC | PRN

## 2013-12-22 MED ORDER — HEPARIN (PORCINE) IN NACL 2-0.9 UNIT/ML-% IJ SOLN
INTRAMUSCULAR | Status: AC
  Filled 2013-12-22: qty 1000

## 2013-12-22 MED ORDER — VERAPAMIL HCL 2.5 MG/ML IV SOLN
INTRAVENOUS | Status: AC
  Filled 2013-12-22: qty 2

## 2013-12-22 MED ORDER — ASPIRIN 81 MG PO CHEW
CHEWABLE_TABLET | ORAL | Status: AC
  Filled 2013-12-22: qty 1

## 2013-12-22 MED ORDER — HEPARIN SODIUM (PORCINE) 1000 UNIT/ML IJ SOLN
INTRAMUSCULAR | Status: AC
Start: 2013-12-22 — End: 2013-12-22
  Filled 2013-12-22: qty 1

## 2013-12-22 MED ORDER — LIDOCAINE HCL (PF) 1 % IJ SOLN
INTRAMUSCULAR | Status: AC
Start: 2013-12-22 — End: 2013-12-22
  Filled 2013-12-22: qty 30

## 2013-12-22 MED ORDER — SODIUM CHLORIDE 0.9 % IJ SOLN: 3.0000 mL | Freq: Two times a day (BID) | INTRAMUSCULAR | Status: DC

## 2013-12-22 MED ORDER — FENTANYL CITRATE 0.05 MG/ML IJ SOLN
INTRAMUSCULAR | Status: AC
  Filled 2013-12-22: qty 2

## 2013-12-22 MED ORDER — MIDAZOLAM HCL 2 MG/2ML IJ SOLN
INTRAMUSCULAR | Status: AC
  Filled 2013-12-22: qty 2

## 2013-12-22 MED ORDER — MORPHINE SULFATE 2 MG/ML IJ SOLN: 2.0000 mg | INTRAMUSCULAR | Status: DC | PRN

## 2013-12-22 MED ORDER — VERAPAMIL HCL 2.5 MG/ML IV SOLN
INTRAVENOUS | Status: AC
Start: 2013-12-22 — End: 2013-12-22
  Filled 2013-12-22: qty 2

## 2013-12-22 MED ORDER — SODIUM CHLORIDE 0.9 % IV SOLN: 1.0000 mL/kg/h | INTRAVENOUS | Status: DC

## 2013-12-22 MED ORDER — ASPIRIN 81 MG PO CHEW
81.0000 mg | CHEWABLE_TABLET | ORAL | Status: AC
  Administered 2013-12-22: 81 mg via ORAL

## 2013-12-22 MED ORDER — ACETAMINOPHEN 325 MG PO TABS
ORAL_TABLET | ORAL | Status: AC
  Filled 2013-12-22: qty 2

## 2013-12-22 MED ORDER — ACETAMINOPHEN 325 MG PO TABS
650.0000 mg | ORAL_TABLET | ORAL | Status: DC | PRN
Start: 2013-12-22 — End: 2013-12-22
  Administered 2013-12-22: 650 mg via ORAL

## 2013-12-22 MED ORDER — SODIUM CHLORIDE 0.9 % IV SOLN
INTRAVENOUS | Status: DC
  Administered 2013-12-22: 07:00:00 via INTRAVENOUS

## 2013-12-22 NOTE — H&P (View-Only) (Signed)
Patient ID: [name], female   DOB: 1975/03/06, 38 y.o.   MRN: [id number]     Patient Name: [name] Date of Encounter: 12/11/2013  Primary Care Provider:  Salena Saner., MD Primary Cardiologist:  [name]   Problem List   Past Medical History  Diagnosis Date  . Asthma     no inhaler  last "attack" 1999  . History of endometriosis   . GERD (gastroesophageal reflux disease)   . Gestational diabetes mellitus   . Uterine prolapse    Past Surgical History  Procedure Laterality Date  . Cesarean section  2011  . Diagnostic laparoscopy  2003  . Svd       x 1  . Vaginal hysterectomy  06/18/2011    Procedure: HYSTERECTOMY VAGINAL;  Surgeon: Marvene Staff, MD;  Location: [facility];  Service: Gynecology;  Laterality: N/A;  and bilateral salpingectomy, vault suspension  . Cystoscopy  06/18/2011    Procedure: CYSTOSCOPY;  Surgeon: Reece Packer, MD;  Location: [facility];  Service: Urology;;    Allergies  Allergies  Allergen Reactions  . Penicillins Other (See Comments)    Childhood reaction. Has tolerated Amoxicillin    HPI  Marchell A Wulff is a 38 y.o. female with a hx of asthma (last flare up in 1999) who presented to the Emergency Department complaining of intermittent gradual onset chest pain on 11/08/2013. Pt states that the pain started as a "pulling" to the center of her chest and started to wrap around to her back. Mrs. Sieling states that a week ago she had some mild chest pain with associated left arm pain, which resolved after taking an Asprin. She states that the current onset of chest pain has been constant today and describes the pain as a "pressure" in nature. Pt does endorse some mild SOB especially after talking a lot. She states that the SOB is exacerbated by deep breathing and exertion. Denies any joint pain, hx of blood clots, taking birth control pills, estrogen supplements, no hx of CHF, hx of HTN, or hx of DM. No FH of early  heart dz.  The patient was seen by a new PCP who thinks this is a costochondritis and prescribed ibuprofen, prednisone taper. She still continues to have pain, maybe slightly easier. The pain is present almost all the time, can be worsening on palpation but also on exertion. No palpitations or syncope.   Home Medications  Prior to Admission medications   Medication Sig Start Date End Date Taking? Authorizing Provider  DULoxetine (CYMBALTA) 20 MG capsule Take 20 mg by mouth as needed.   Yes Historical Provider, MD  ibuprofen (ADVIL,MOTRIN) 200 MG tablet Take 800 mg by mouth every 8 (eight) hours as needed. For pain   Yes Historical Provider, MD  Ibuprofen-Famotidine (DUEXIS) 800-26.6 MG TABS Take by mouth as directed.   Yes Historical Provider, MD  LORazepam (ATIVAN) 1 MG tablet Take 1 mg by mouth as directed.   Yes Historical Provider, MD  naproxen (NAPROSYN) 375 MG tablet Take 1 tablet (375 mg total) by mouth 2 (two) times daily. 11/08/13  Yes Malvin Johns, MD  predniSONE (DELTASONE) 10 MG tablet Taper dose. 12/04/13  Yes Historical Provider, MD  Vitamin D, Ergocalciferol, (DRISDOL) 50000 UNITS CAPS capsule Take 50,000 Units by mouth 2 (two) times a week.  07/07/13  Yes Historical Provider, MD    Family History  Family History  Problem Relation Age of Onset  . Asthma Other   .  Liver cancer    . Prostate cancer    . Lung cancer    . Bone cancer    . Kidney failure      Social History  History   Social History  . Marital Status: Married    Spouse Name: N/A    Number of Children: N/A  . Years of Education: N/A   Occupational History  . Not on file.   Social History Main Topics  . Smoking status: Never Smoker   . Smokeless tobacco: Never Used  . Alcohol Use: No  . Drug Use: No  . Sexual Activity: Yes    Birth Control/ Protection: None, Surgical   Other Topics Concern  . Not on file   Social History Narrative  . No narrative on file     Review of Systems, as per  HPI, otherwise negative General:  No chills, fever, night sweats or weight changes.  Cardiovascular:  No chest pain, dyspnea on exertion, edema, orthopnea, palpitations, paroxysmal nocturnal dyspnea. Dermatological: No rash, lesions/masses Respiratory: No cough, dyspnea Urologic: No hematuria, dysuria Abdominal:   No nausea, vomiting, diarrhea, bright red blood per rectum, melena, or hematemesis Neurologic:  No visual changes, wkns, changes in mental status. All other systems reviewed and are otherwise negative except as noted above.  Physical Exam  Blood pressure 112/80, pulse 80, height 5\' 5"  (1.651 m), weight 225 lb (102.059 kg), last menstrual period 06/03/2011, SpO2 98.00%.  General: Pleasant, NAD Psych: Normal affect. Neuro: Alert and oriented X 3. Moves all extremities spontaneously. HEENT: Normal  Neck: Supple without bruits or JVD. Lungs:  Resp regular and unlabored, CTA. Heart: RRR no s3, s4, or murmurs. Abdomen: Soft, non-tender, non-distended, BS + x 4.  Extremities: No clubbing, cyanosis or edema. DP/PT/Radials 2+ and equal bilaterally.  Labs:  No results found for this basename: CKTOTAL, CKMB, TROPONINI,  in the last 72 hours Lab Results  Component Value Date   WBC 10.9* 11/08/2013   HGB 12.5 11/08/2013   HCT 37.9 11/08/2013   MCV 88.6 11/08/2013   PLT 240 11/08/2013    Lab Results  Component Value Date   DDIMER <0.27 11/08/2013   No components found with this basename: POCBNP,     Component Value Date/Time   NA 138 11/08/2013 2030   K 3.9 11/08/2013 2030   CL 101 11/08/2013 2030   CO2 25 11/08/2013 2030   GLUCOSE 122* 11/08/2013 2030   BUN 13 11/08/2013 2030   CREATININE 0.90 11/08/2013 2030   CALCIUM 9.1 11/08/2013 2030   PROT 6.6 05/10/2012 0221   ALBUMIN 3.4* 05/10/2012 0221   AST 14 05/10/2012 0221   ALT 16 05/10/2012 0221   ALKPHOS 95 05/10/2012 0221   BILITOT 0.2* 05/10/2012 0221   GFRNONAA 79* 11/08/2013 2030   GFRAA >90 11/08/2013 2030   No results found for  this basename: CHOL, HDL, LDLCALC, TRIG    Accessory Clinical Findings  Echocardiogram - none  ECG - SR, Q in V1-2    Assessment & Plan  38 year old female   1. Chest pain with some typical and some atypical features, I agree with her PCP that this seems like a costochondritis, however exertional component, pressure like and constant fatigue are concerning for ischemia. We will oredr exercise nuclear stress test (she is post hysterectomy).  2. Fatigue - we will order TSH   3. BP - controlled  Follow up in 1 month.    [name], MD, [facility] 12/11/2013, 9:12 AM

## 2013-12-22 NOTE — Discharge Instructions (Signed)
Return To Work _Johnette McCain__ was treated at our facility. INJURY OR ILLNESS WAS: _____ Work-related __X___ Not work-related _____ Undetermined if work-related RETURN TO WORK  Employee may return to work on:  Thursday Nov._12, 2015_  Employee may return to modified work on: ____________________ WORK ACTIVITY RESTRICTIONS Work activities not tolerated include: _____ Bending _____ Prolonged sitting _____ Lifting _____ Squatting _____ Prolonged standing _____ Lesle Reek _____ Reaching _____ Pushing and pulling _____ Walking _____ Other ____________________ Show this Return to Work statement to Optician, dispensing at work as soon as possible. Your employer should be aware of your condition and can help with the necessary work activity restrictions. If you wish to return to work sooner than the date above, or if you have further problems which make it difficult for you to return at that time, please call us or your caregiver. __Dr. Shanon Brow Harding__ Physician Name (Printed) _________________________________________ Physician Signature  _11/06/2015____ Date Document Released: 02/02/2005 Document Revised: 04/27/2011 Document Reviewed: 07/20/2006 [facility] Patient Information 2015 [place], [place]. This information is not intended to replace advice given to you by your health care provider. Make sure you discuss any questions you have with your health care provider.

## 2013-12-22 NOTE — CV Procedure (Signed)
CARDIAC CATHETERIZATION REPORT  NAME:  ROLANDE MOE, [name]   MRN: [id number] DOB:  October 05, 1975   ADMIT DATE: 12/22/2013 Procedure Date: 12/22/2013  INTERVENTIONAL CARDIOLOGIST: Leonie Man, M.D., MS PRIMARY CARE PROVIDER: Salena Saner., MD PRIMARY CARDIOLOGIST: Ena Dawley, MD  PATIENT:  Cassie Cap, [name] is a 38 y.o. female with a past medical history  Gestational diabetes, obesity and endometriosis who was seen by Dr. Meda Coffee in consultation on October 26with complaint of central chest discomfort/pain that wraps around to the back. She had some relief with taking aspirin. It was mildly exertional in nature. She also knows exertional dyspnea. The symptoms were concerning for at least moderate risk for cardiac etiology, therefore a Myoview stress test was performed that suggested a potentially ischemic perfusion defect in the mid anterior wall consistent with diagonal distribution. Based on the Intermediate Risk results, she is referred for cardiac catheterization to evaluate her coronary anatomy.  PRE-OPERATIVE DIAGNOSIS:     CHEST PAIN WITH MODERATE RISK FOR CARDIAC ETIOLOGY  INTERMEDIATE RISK NUCLEAR STRESS TEST  PROCEDURES PERFORMED:    Left Heart Catheterization with Native Coronary Angiography via RIGHT RADIAL Artery   PROCEDURE: The patient was brought to the 2nd [place] Cardiac Catheterization Lab in the fasting state and prepped and draped in the usual sterile fashion for RIGHT RADIAL artery access. A modified Allen's test was performed on the RIGHT wrist demonstrating excellent collateral flow for radial access.   Sterile technique was used including antiseptics, [name], gloves, gown, hand hygiene, mask and sheet. Skin prep: Chlorhexidine.   Consent: Risks of procedure as well as the alternatives and risks of each were explained to the (patient/caregiver). Consent for procedure obtained.   Time Out: Verified patient identification, verified procedure,  site/side was marked, verified correct patient position, special equipment/implants available, medications/allergies/relevent history reviewed, required imaging and test results available. Performed.  Access:   RIGHT RADIAL Artery: 5 Fr Sheath -  Seldinger Technique (Angiocath Micropuncture Kit)  Radial Cocktail - 10 mL; IV Heparin 5000 Units  Additional 5 mg IA verapamil used for potential spasm.   Left Heart Catheterization: 5Fr Catheters advanced or exchanged over a long exchange safety J-wire; TIG 4.0 catheter advanced first.  Left Coronary Artery Cineangiography: TIG 4.0 Catheter  Right Coronary Artery Cineangiography: JR 4 Catheter   LV Hemodynamics: TIG 4.0 Catheter  Sheath removed in the cardiac cath lab with VASC Band applied for hemostasis.  VASC  Band: 0830  Hours; 15 mL air  FINDINGS:  Hemodynamics:   Central Aortic Pressure / Mean: 90/70/81 mmHg; final blood pressure was  113/63/74 mmHg with a heart rate of 78  Left Ventricular Pressure / LVEDP: 94/18/19 mmHg  Left Ventriculography: not performed due to concern for possible radial artery spasm while advancing the pigtail catheter  Coronary Anatomy:  Dominance: right  Left Main: large-caliber vessel that bifurcates into the LAD and Circumflex. LAD: large-caliber vessel that gives rise to a relatively proximal major first diagonal branch that is at least the same size. The vessel then courses down around the apex perfusing the distal infero-apex. In the mid vessel there is a short segment that appears to be potentially intra-myocardial with bridging. Otherwise there is no angiographic evidence of CAD.  D1: large-caliber vessel that perfuses the inferolateral wall. Angiographically normal.  Left Circumflex: large-caliber vessel that gives rise to a very proximal OM1. It then bifurcates in the AV groove into large lateral OM 2 and OM 3 with a very small AV groove  branch.  Angiographically Normal.  OM1: moderate to  large caliber vessel that bifurcates into 2 moderate caliber branches that tapered down along the lateral wall. A tapered and small vessels but are angiographic normal. Somewhat tortuous.  OM 2: very large caliber tortuous vessel with several small branches distally. It reaches almost all the way to the inferoapex. Angiographically normal.  OM 3: moderate caliber vessel that courses more as a posterior lateral branch.  angiographically normal.   RCA: normal caliber dominant vessel that has 2 major RV marginal branches and a large atrial branch. It essentially terminates as the PDA with a very small posterolateral system.  Angiographically normal.  After reviewing the initial angiography, there is no potential culprit lesion to explain the positive stress test.  MEDICATIONS:  Anesthesia:  Local Lidocaine 4 ml  Sedation:  2 mg IV Versed, 50 mcg IV fentanyl ;   Premedication: n/a  Omnipaque Contrast: 80 ml  Anticoagulation:  IV Heparin 5000 Units   Radial Cocktail: 5 mg Verapamil, 400 mcg NTG, 2 ml 2% Lidocaine in 10 ml NS  IA 5 mg Verapamil  250 ml NS bolus  PATIENT DISPOSITION:    The patient was transferred to the PACU holding area in a hemodynamicaly stable, chest pain free condition.  The patient tolerated the procedure well, and there were no complications.  EBL:   < 10 ml  The patient was stable before, during, and after the procedure.  POST-OPERATIVE DIAGNOSIS:    Normal coronary arteries and no evidence of spasm or potential culprit lesion.  Mildly elevated LVEDP  Intermittent episodes of 5-10 beats  PAT versus intermittent sinus tachycardia  PLAN OF CARE:  Transferred to post procedure outpatient holding area & plan discharge later today.  Follow-up with Dr. Meda Coffee as scheduled.    Leonie Man, M.D., M.S. Interventional Cardiologist   Pager # [phone]

## 2013-12-22 NOTE — Interval H&P Note (Signed)
History and Physical Interval Note:  12/22/2013 7:40 AM  Asja A Magdalene Molly, PhD  has presented today for surgery, with the diagnosis of abnormal stress test performed to evaluated Chest Pain with Moderate Risk for Cardiac Etiology -- (Class II Angina).  Myoview Results: Overall Impression: Intermediate risk stress nuclear study with a medium severity mid anterior wall defect, most consistent with ischemia in a diagonal artery distribution.Reubin Milan fully exclude shifting breast attenuation artifact,  LV Ejection Fraction: 67%. LV Wall Motion: NL LV Function; NL Wall Motion    The various methods of treatment have been discussed with the patient and family. After consideration of risks, benefits and other options for treatment, the patient has consented to  Procedure(s): LEFT HEART CATHETERIZATION WITH CORONARY ANGIOGRAM (N/A) +/- PCI as a surgical intervention .    The patient's history has been reviewed, patient examined, no change in status, stable for surgery.  I have reviewed the patient's chart and labs.    Performing MD:  Leonie Man, M.D., M.S.  Procedure:  LEFT HEART CATHETERIZATION WITH CORONARY ANGIOGRAPHY & POSSIBLE PERCUTANEOUS CORONARY INTERVENTION.  The procedure with Risks/Benefits/Alternatives and Indications was reviewed with the patient.  Risks / Complications include, but not limited to: Death, MI, CVA/TIA, VF/VT (with defibrillation), Bradycardia (need for temporary pacer placement), contrast induced nephropathy, bleeding / bruising / hematoma / pseudoaneurysm, vascular or coronary injury (with possible emergent CT or Vascular Surgery), adverse medication reactions, infection.  Additional risks involving the use of radiation with the possibility of radiation burns and cancer were explained in detail.  The patient voices understanding and agree to proceed.    Questions were answered to the patient's satisfaction.    Cath Lab Visit (complete for each Cath Lab  visit)  Clinical Evaluation Leading to the Procedure:   ACS: No.  Non-ACS:    Anginal Classification: CCS II  Anti-ischemic medical therapy: No Therapy  Non-Invasive Test Results: Intermediate-risk stress test findings: cardiac mortality 1-3%/year  Prior CABG: No previous CABG  HARDING,DAVID W

## 2013-12-27 ENCOUNTER — Encounter: Payer: Self-pay | Admitting: *Deleted

## 2013-12-27 ENCOUNTER — Telehealth: Payer: Self-pay | Admitting: Cardiology

## 2013-12-27 NOTE — Telephone Encounter (Signed)
Contacted the pt and she is calling Dr Meda Coffee and myself to discuss a return to work date be set for 01/01/14, due to soreness in her wrist area, where the pt recently had a cardiac catheterization done.  Informed Dr Meda Coffee of this and Dr Meda Coffee advised to write the pt a return to work note on Monday 01/01/14 due to recent heart cath and recovery time.  Informed the pt that her work note will be at the front desk for her to pick-up today 11/11. Pt verbalized understanding and very gracious for all the assistance provided.

## 2013-12-27 NOTE — Telephone Encounter (Signed)
New message           Pt would like for Ivy to give her a call / pt did not disclose any other info

## 2014-01-01 ENCOUNTER — Telehealth: Payer: Self-pay | Admitting: Cardiology

## 2014-01-07 NOTE — Telephone Encounter (Signed)
Walk-In Patient Form completed at front reception on 11.16.15: will take to [facility] on 11.18.15 when she returns to the office

## 2014-01-08 ENCOUNTER — Ambulatory Visit (INDEPENDENT_AMBULATORY_CARE_PROVIDER_SITE_OTHER): Payer: BC Managed Care – PPO | Admitting: Cardiology

## 2014-01-08 ENCOUNTER — Other Ambulatory Visit (HOSPITAL_COMMUNITY): Payer: Self-pay | Admitting: Cardiology

## 2014-01-08 ENCOUNTER — Ambulatory Visit (HOSPITAL_COMMUNITY): Payer: BC Managed Care – PPO | Attending: Cardiology | Admitting: Cardiology

## 2014-01-08 ENCOUNTER — Encounter: Payer: Self-pay | Admitting: Cardiology

## 2014-01-08 VITALS — BP 130/66 | HR 82 | Ht 64.0 in | Wt 226.0 lb

## 2014-01-08 DIAGNOSIS — M79601 Pain in right arm: Secondary | ICD-10-CM | POA: Insufficient documentation

## 2014-01-08 DIAGNOSIS — R200 Anesthesia of skin: Secondary | ICD-10-CM | POA: Insufficient documentation

## 2014-01-08 DIAGNOSIS — R079 Chest pain, unspecified: Secondary | ICD-10-CM

## 2014-01-08 DIAGNOSIS — I729 Aneurysm of unspecified site: Secondary | ICD-10-CM

## 2014-01-08 HISTORY — DX: Pain in right arm: M79.601

## 2014-01-08 NOTE — Progress Notes (Signed)
Patient ID: Cassie Cap, PhD, female   DOB: Mar 04, 1975, 38 y.o.   MRN: [id number] Patient ID: Cassie Cap, PhD, female   DOB: Feb 20, 1975, 38 y.o.   MRN: [id number]     Patient Name: Cassie Cap, PhD Date of Encounter: 01/08/2014  Primary Care Provider:  Salena Saner., MD Primary Cardiologist:  [name]   Problem List   Past Medical History  Diagnosis Date  . Asthma     no inhaler  last "attack" 1999  . History of endometriosis   . GERD (gastroesophageal reflux disease)   . Gestational diabetes mellitus   . Uterine prolapse    Past Surgical History  Procedure Laterality Date  . Cesarean section  2011  . Diagnostic laparoscopy  2003  . Svd       x 1  . Vaginal hysterectomy  06/18/2011    Procedure: HYSTERECTOMY VAGINAL;  Surgeon: Marvene Staff, MD;  Location: [facility];  Service: Gynecology;  Laterality: N/A;  and bilateral salpingectomy, vault suspension  . Cystoscopy  06/18/2011    Procedure: CYSTOSCOPY;  Surgeon: Reece Packer, MD;  Location: [facility];  Service: Urology;;    Allergies  Allergies  Allergen Reactions  . Penicillins Other (See Comments)    Childhood reaction. Has tolerated Amoxicillin    HPI  Abygale A Glauber is a 38 y.o. female with a hx of asthma (last flare up in 1999) who presented to the Emergency Department complaining of intermittent gradual onset chest pain on 11/08/2013. Pt states that the pain started as a "pulling" to the center of her chest and started to wrap around to her back. Mrs. Carr states that a week ago she had some mild chest pain with associated left arm pain, which resolved after taking an Asprin. She states that the current onset of chest pain has been constant today and describes the pain as a "pressure" in nature. Pt does endorse some mild SOB especially after talking a lot. She states that the SOB is exacerbated by deep breathing and exertion. Denies any joint pain, hx of blood clots, taking  birth control pills, estrogen supplements, no hx of CHF, hx of HTN, or hx of DM. No FH of early heart dz.  The patient was seen by a new PCP who thinks this is a costochondritis and prescribed ibuprofen, prednisone taper. She still continues to have pain, maybe slightly easier. The pain is present almost all the time, can be worsening on palpation but also on exertion. No palpitations or syncope.   01/08/2014 - abnormal stress test --> normal coronaries, right arm pain and fingers tingling ever since she had a cath. Chest pain very sporadic, sharp with excitement.    Home Medications  Prior to Admission medications   Medication Sig Start Date End Date Taking? Authorizing Provider  DULoxetine (CYMBALTA) 20 MG capsule Take 20 mg by mouth as needed.   Yes Historical Provider, MD  ibuprofen (ADVIL,MOTRIN) 200 MG tablet Take 800 mg by mouth every 8 (eight) hours as needed. For pain   Yes Historical Provider, MD  Ibuprofen-Famotidine (DUEXIS) 800-26.6 MG TABS Take by mouth as directed.   Yes Historical Provider, MD  LORazepam (ATIVAN) 1 MG tablet Take 1 mg by mouth as directed.   Yes Historical Provider, MD  naproxen (NAPROSYN) 375 MG tablet Take 1 tablet (375 mg total) by mouth 2 (two) times daily. 11/08/13  Yes Malvin Johns, MD  predniSONE (DELTASONE) 10 MG tablet Taper dose. 12/04/13  Yes  Historical Provider, MD  Vitamin D, Ergocalciferol, (DRISDOL) 50000 UNITS CAPS capsule Take 50,000 Units by mouth 2 (two) times a week.  07/07/13  Yes Historical Provider, MD    Family History  Family History  Problem Relation Age of Onset  . Asthma Other   . Liver cancer    . Prostate cancer    . Lung cancer    . Bone cancer    . Kidney failure      Social History  History   Social History  . Marital Status: Married    Spouse Name: N/A    Number of Children: N/A  . Years of Education: N/A   Occupational History  . Not on file.   Social History Main Topics  . Smoking status: Never Smoker     . Smokeless tobacco: Never Used  . Alcohol Use: No  . Drug Use: No  . Sexual Activity: Yes    Birth Control/ Protection: None, Surgical   Other Topics Concern  . Not on file   Social History Narrative     Review of Systems, as per HPI, otherwise negative General:  No chills, fever, night sweats or weight changes.  Cardiovascular:  No chest pain, dyspnea on exertion, edema, orthopnea, palpitations, paroxysmal nocturnal dyspnea. Dermatological: No rash, lesions/masses Respiratory: No cough, dyspnea Urologic: No hematuria, dysuria Abdominal:   No nausea, vomiting, diarrhea, bright red blood per rectum, melena, or hematemesis Neurologic:  No visual changes, wkns, changes in mental status. All other systems reviewed and are otherwise negative except as noted above.  Physical Exam  Blood pressure 130/66, pulse 82, height 5\' 4"  (1.626 m), weight 226 lb (102.513 kg), last menstrual period 06/03/2011.  General: Pleasant, NAD Psych: Normal affect. Neuro: Alert and oriented X 3. Moves all extremities spontaneously. HEENT: Normal  Neck: Supple without bruits or JVD. Lungs:  Resp regular and unlabored, CTA. Heart: RRR no s3, s4, or murmurs. Abdomen: Soft, non-tender, non-distended, BS + x 4.  Extremities: No clubbing, cyanosis or edema. DP/PT/Radials 2+ and equal bilaterally.  Labs:  No results for input(s): CKTOTAL, CKMB, TROPONINI in the last 72 hours. Lab Results  Component Value Date   WBC 9.5 12/21/2013   HGB 12.3 12/21/2013   HCT 37.8 12/21/2013   MCV 88.6 12/21/2013   PLT 242.0 12/21/2013    Lab Results  Component Value Date   DDIMER <0.27 11/08/2013   Invalid input(s): POCBNP    Component Value Date/Time   NA 140 12/21/2013 1528   K 3.9 12/21/2013 1528   CL 106 12/21/2013 1528   CO2 28 12/21/2013 1528   GLUCOSE 86 12/21/2013 1528   BUN 15 12/21/2013 1528   CREATININE 0.9 12/21/2013 1528   CALCIUM 8.7 12/21/2013 1528   PROT 6.6 05/10/2012 0221   ALBUMIN 3.4*  05/10/2012 0221   AST 14 05/10/2012 0221   ALT 16 05/10/2012 0221   ALKPHOS 95 05/10/2012 0221   BILITOT 0.2* 05/10/2012 0221   GFRNONAA 79* 11/08/2013 2030   GFRAA >90 11/08/2013 2030   No results found for: CHOL  Accessory Clinical Findings  Echocardiogram - none  ECG - SR, Q in V1-2  Exercise nuclear stress test; 12/18/2013 Quantitative Gated Spect Images QGS EDV: 89 ml QGS ESV: 29 ml  Impression Exercise Capacity: Fair exercise capacity. BP Response: Normal blood pressure response. Clinical Symptoms: No significant symptoms noted. ECG Impression: No significant ST segment change suggestive of ischemia. Comparison with Prior Nuclear Study: No previous nuclear study performed  Overall Impression:  Intermediate risk stress nuclear study with a medium severity mid anterior wall defect, most consistent with ischemia in a diagonal artery distribution.Reubin Milan fully exclude shifting breast attenuation artifact,  LV Ejection Fraction: 67%. LV Wall Motion: NL LV Function; NL Wall Motion    Left cardiac cath: 12/22/2013 POST-OPERATIVE DIAGNOSIS:   Normal coronary arteries and no evidence of spasm or potential culprit lesion.  Mildly elevated LVEDP  Intermittent episodes of 5-10 beats PAT versus intermittent sinus tachycardia PLAN OF CARE:  Transferred to post procedure outpatient holding area & plan discharge later today.  Follow-up with Dr. Meda Coffee as scheduled.    Assessment & Plan  38 year old female   1. Chest pain with some typical and some atypical features, I agree with her PCP that this seems like a costochondritis, however exertional component, pressure like and constant fatigue are concerning for ischemia. We will oredr exercise nuclear stress test (she is post hysterectomy). Widely open coronaries on cardiac cath, no further evaluation.   2. Fatigue -  TSH normal, encouraged to restart exercising  3. BP - controlled  4. Righ upper extremity pain  and finger tingling - we will check for arterial and venous Duplex to check for pseudoaneurysm and DVT. Good pulses, no bruit.   Follow up in 1 year.    [name], MD, [facility] 01/08/2014, 4:25 PM

## 2014-01-08 NOTE — Progress Notes (Signed)
Right upper extremity arterial duplex performed

## 2014-01-08 NOTE — Patient Instructions (Signed)
Your physician recommends that you continue on your current medications as directed. Please refer to the Current Medication list given to you today.   Your physician has requested that you have a upper extremity venous and arterial duplex. This test is an ultrasound of the veins in the legs or arms. It looks at venous blood flow that carries blood from the heart to the legs or arms. Allow one hour for a Lower Venous exam. Allow thirty minutes for an Upper Venous exam. There are no restrictions or special instructions.  THIS WILL BE DONE TODAY    Your physician wants you to follow-up in: [place] will receive a reminder letter in the mail two months in advance. If you don't receive a letter, please call our office to schedule the follow-up appointment.

## 2014-01-25 ENCOUNTER — Encounter (HOSPITAL_COMMUNITY): Payer: Self-pay | Admitting: Cardiology

## 2015-01-23 ENCOUNTER — Ambulatory Visit (INDEPENDENT_AMBULATORY_CARE_PROVIDER_SITE_OTHER): Payer: BC Managed Care – PPO | Admitting: Cardiology

## 2015-01-23 ENCOUNTER — Encounter: Payer: Self-pay | Admitting: Cardiology

## 2015-01-23 VITALS — BP 106/70 | HR 73 | Ht 65.0 in | Wt 212.8 lb

## 2015-01-23 DIAGNOSIS — R079 Chest pain, unspecified: Secondary | ICD-10-CM | POA: Diagnosis not present

## 2015-01-23 DIAGNOSIS — R5382 Chronic fatigue, unspecified: Secondary | ICD-10-CM | POA: Diagnosis not present

## 2015-01-23 DIAGNOSIS — M25512 Pain in left shoulder: Secondary | ICD-10-CM

## 2015-01-23 NOTE — Progress Notes (Signed)
Patient ID: [name], female   DOB: [birth year], 39 y.o.   MRN: [id number]     Patient Name: [name] Date of Encounter: 01/23/2015  Primary Care Provider:  Nanci Pina, FNP Primary Cardiologist:  [name]  Chief complain: chest pain - shoulder pain  Problem List   Past Medical History  Diagnosis Date  . Asthma     no inhaler  last "attack" 1999  . History of endometriosis   . GERD (gastroesophageal reflux disease)   . Gestational diabetes mellitus   . Uterine prolapse   . Abnormal nuclear stress test - Intermediate Risk with possible Anterior Ischemia 12/21/2013  . Bone spur of right foot 07/18/2013  . Bunion 07/18/2013  . Chest pain with moderate risk for cardiac etiology 12/11/2013  . Fatigue 12/11/2013  . Pain in lower limb 07/18/2013  . Right arm pain 01/08/2014   Past Surgical History  Procedure Laterality Date  . Cesarean section  2011  . Diagnostic laparoscopy  2003  . Svd       x 1  . Vaginal hysterectomy  06/18/2011    Procedure: HYSTERECTOMY VAGINAL;  Surgeon: Marvene Staff, MD;  Location: [facility];  Service: Gynecology;  Laterality: N/A;  and bilateral salpingectomy, vault suspension  . Cystoscopy  06/18/2011    Procedure: CYSTOSCOPY;  Surgeon: Reece Packer, MD;  Location: [facility];  Service: Urology;;  . Left heart catheterization with coronary angiogram N/A 12/22/2013    Procedure: LEFT HEART CATHETERIZATION WITH CORONARY ANGIOGRAM;  Surgeon: Leonie Man, MD;  Location: [facility] C[facility];  Service: Cardiovascular;  Laterality: N/A;    Allergies  Allergies  Allergen Reactions  . Penicillins Other (See Comments)    Childhood reaction. Has tolerated Amoxicillin    HPI  [name] is a 39 y.o. female with a hx of asthma (last flare up in 1999) who presented to the Emergency Department complaining of intermittent gradual onset chest pain on 11/08/2013. Pt states that the pain started as a "pulling" to the center of  her chest and started to wrap around to her back. [name] states that a week ago she had some mild chest pain with associated left arm pain, which resolved after taking an Asprin. She states that the current onset of chest pain has been constant today and describes the pain as a "pressure" in nature. Pt does endorse some mild SOB especially after talking a lot. She states that the SOB is exacerbated by deep breathing and exertion. Denies any joint pain, hx of blood clots, taking birth control pills, estrogen supplements, no hx of CHF, hx of HTN, or hx of DM. No FH of early heart dz.  The patient was seen by a new PCP who thinks this is a costochondritis and prescribed ibuprofen, prednisone taper. She still continues to have pain, maybe slightly easier. The pain is present almost all the time, can be worsening on palpation but also on exertion. No palpitations or syncope.   01/08/2014 - abnormal stress test --> normal coronaries, right arm pain and fingers tingling ever since she had a cath. Chest pain very sporadic, sharp with excitement.   01/23/2015 - the patient is coming after 1 year, she states that her pain she experienced the last year has resolved but now she is experiencing intermittent mid sternal stabbing pain that is not related to activity or food intake, not related to deep inspiration or different position changes. She has been  experiencing significant fibromyalgia pain and a left shoulder pain with inability to rise her shoulder above 30 degrees. She saw an orthopedist who injected a steroid into her shoulder with worsening of her symptoms. Denies DOE, claudications, palpitations or syncope. No LE edema, orthopnea or PND.    Home Medications  Prior to Admission medications   Medication Sig Start Date End Date Taking? Authorizing Provider  DULoxetine (CYMBALTA) 20 MG capsule Take 20 mg by mouth as needed.   Yes Historical Provider, MD  ibuprofen (ADVIL,MOTRIN) 200 MG tablet Take 800  mg by mouth every 8 (eight) hours as needed. For pain   Yes Historical Provider, MD  Ibuprofen-Famotidine (DUEXIS) 800-26.6 MG TABS Take by mouth as directed.   Yes Historical Provider, MD  LORazepam (ATIVAN) 1 MG tablet Take 1 mg by mouth as directed.   Yes Historical Provider, MD  naproxen (NAPROSYN) 375 MG tablet Take 1 tablet (375 mg total) by mouth 2 (two) times daily. 11/08/13  Yes Malvin Johns, MD  predniSONE (DELTASONE) 10 MG tablet Taper dose. 12/04/13  Yes Historical Provider, MD  Vitamin D, Ergocalciferol, (DRISDOL) 50000 UNITS CAPS capsule Take 50,000 Units by mouth 2 (two) times a week.  07/07/13  Yes Historical Provider, MD    Family History  Family History  Problem Relation Age of Onset  . Asthma Other   . Liver cancer    . Prostate cancer    . Lung cancer    . Bone cancer    . Kidney failure      Social History  Social History   Social History  . Marital Status: Married    Spouse Name: N/A  . Number of Children: N/A  . Years of Education: N/A   Occupational History  . Not on file.   Social History Main Topics  . Smoking status: Never Smoker   . Smokeless tobacco: Never Used  . Alcohol Use: No  . Drug Use: No  . Sexual Activity: Yes    Birth Control/ Protection: None, Surgical   Other Topics Concern  . Not on file   Social History Narrative     Review of Systems, as per HPI, otherwise negative General:  No chills, fever, night sweats or weight changes.  Cardiovascular:  No chest pain, dyspnea on exertion, edema, orthopnea, palpitations, paroxysmal nocturnal dyspnea. Dermatological: No rash, lesions/masses Respiratory: No cough, dyspnea Urologic: No hematuria, dysuria Abdominal:   No nausea, vomiting, diarrhea, bright red blood per rectum, melena, or hematemesis Neurologic:  No visual changes, wkns, changes in mental status. All other systems reviewed and are otherwise negative except as noted above.  Physical Exam  Blood pressure 106/70, pulse  73, height 5\' 5"  (1.651 m), weight 212 lb 12.8 oz (96.525 kg), last menstrual period 06/03/2011.  General: Pleasant, NAD Psych: Normal affect. Neuro: Alert and oriented X 3. Moves all extremities spontaneously. HEENT: Normal  Neck: Supple without bruits or JVD. Lungs:  Resp regular and unlabored, CTA. Heart: RRR no s3, s4, or murmurs. Abdomen: Soft, non-tender, non-distended, BS + x 4.  Extremities: No clubbing, cyanosis or edema. DP/PT/Radials 2+ and equal bilaterally.  Labs:  No results for input(s): CKTOTAL, CKMB, TROPONINI in the last 72 hours. Lab Results  Component Value Date   WBC 9.5 12/21/2013   HGB 12.3 12/21/2013   HCT 37.8 12/21/2013   MCV 88.6 12/21/2013   PLT 242.0 12/21/2013    Lab Results  Component Value Date   DDIMER <0.27 11/08/2013   Invalid input(s): POCBNP  Component Value Date/Time   NA 140 12/21/2013 1528   K 3.9 12/21/2013 1528   CL 106 12/21/2013 1528   CO2 28 12/21/2013 1528   GLUCOSE 86 12/21/2013 1528   BUN 15 12/21/2013 1528   CREATININE 0.9 12/21/2013 1528   CALCIUM 8.7 12/21/2013 1528   PROT 6.6 05/10/2012 0221   ALBUMIN 3.4* 05/10/2012 0221   AST 14 05/10/2012 0221   ALT 16 05/10/2012 0221   ALKPHOS 95 05/10/2012 0221   BILITOT 0.2* 05/10/2012 0221   GFRNONAA 79* 11/08/2013 2030   GFRAA >90 11/08/2013 2030   No results found for: CHOL  Accessory Clinical Findings  Echocardiogram - none  ECG - SR, Q in V1-2  Exercise nuclear stress test; 12/18/2013 Quantitative Gated Spect Images QGS EDV: 89 ml QGS ESV: 29 ml  Impression Exercise Capacity: Fair exercise capacity. BP Response: Normal blood pressure response. Clinical Symptoms: No significant symptoms noted. ECG Impression: No significant ST segment change suggestive of ischemia. Comparison with Prior Nuclear Study: No previous nuclear study performed  Overall Impression: Intermediate risk stress nuclear study with a medium severity mid anterior wall defect, most  consistent with ischemia in a diagonal artery distribution.Reubin Milan fully exclude shifting breast attenuation artifact,  LV Ejection Fraction: 67%. LV Wall Motion: NL LV Function; NL Wall Motion    Left cardiac cath: 12/22/2013 POST-OPERATIVE DIAGNOSIS:   Normal coronary arteries and no evidence of spasm or potential culprit lesion.  Mildly elevated LVEDP  Intermittent episodes of 5-10 beats PAT versus intermittent sinus tachycardia PLAN OF CARE:  Transferred to post procedure outpatient holding area & plan discharge later today.  Follow-up with Dr. Meda Coffee as scheduled.  EKG on 01/22/2014 - normal sinus rhythm, early repolarization in inferolateral leads otherwise normal EKG and unchanged from prior.  Assessment & Plan  39 year old female   1. Chest pain with atypical features - possible costochondritis, she had a clean cath a year ago it doesn't seem to be pericarditis either, she is advised to take Ibuprofen 600 mg p BID for the next two months.  2. Left shoulder pain with inability to rise her arm above 90 degrees. It seems like possible rotator cuff tear, she is advised to follow with ortho, this is very unlikely related to heart.   3. Fatigue -  TSH normal, encouraged to restart exercising  Follow up in 2 months.    [name], MD, [facility] 01/23/2015, 12:02 PM

## 2015-01-23 NOTE — Patient Instructions (Signed)
Your physician recommends that you continue on your current medications as directed. Please refer to the Current Medication list given to you today.    Your physician recommends that you schedule a follow-up appointment in: 2 MONTHS WITH DR NELSON  

## 2015-02-21 ENCOUNTER — Ambulatory Visit (INDEPENDENT_AMBULATORY_CARE_PROVIDER_SITE_OTHER): Payer: BC Managed Care – PPO | Admitting: Podiatry

## 2015-02-21 ENCOUNTER — Other Ambulatory Visit: Payer: Self-pay

## 2015-02-21 ENCOUNTER — Encounter: Payer: Self-pay | Admitting: Podiatry

## 2015-02-21 VITALS — BP 116/69 | HR 69

## 2015-02-21 DIAGNOSIS — M21619 Bunion of unspecified foot: Secondary | ICD-10-CM

## 2015-02-21 DIAGNOSIS — M205X1 Other deformities of toe(s) (acquired), right foot: Secondary | ICD-10-CM

## 2015-02-21 NOTE — Patient Instructions (Signed)
Pre op paper reviewed for McBride bunionectomy on right.

## 2015-02-21 NOTE — Progress Notes (Signed)
Subjective: 40 year old female presents complaining of pain in right foot. Patient request surgical intervention.  She was here in 2013 for bunion pain and surgical options were discussed.  Past history reveal: She was diagnosed with bone spur at bunion area 4 years ago.  Has been diagnosed with Fibromyalgia in 2013.  Has gained much weight and working on weight control. Recently diagnosed with bone spur in her neck and going through physical therapy. Also having pain off and on in ankle joint right foot  Objective: Dermatologic: Plantar callus under 2nd and 3rd MPJ area bilateral, under hallux bilateral.  Neurovascular status are within normal bilateral. Orthopedic: Hallux valgus with bunion bilateral, painful right. Contracted 5th digit with deformed nail right.  Limited joint motion first MPJ bilateral, painful on right. Positive for tight Achilles tendon bilateral.   Radiographic examination of right foot: AP view shoe show  long 2nd and 3rd Metatarsal with short first metatarsal bone (-4), narrowing of joint space, enlarged medial eminence of the first metatarsal, Fibular sesamoid position at 4, increased lateral deviation of the IPJ right hallux. Lateral view show cavus type foot, normal CYMA line and positive for dorsal spur over the first Metatarsal head and small posterior calcaneal spur.   Assessment: 1. 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 2. Hallux limitus bilateral. 3. Forefoot varus. 4. Plantar callus sub 2-3 bilateral.  Plan:  Reviewed findings and available options. Patient seeking surgical option. McBride bunionectomy reviewed for right foot.  Patient understand it may take another procedure later on if continues to have mid foot or rearfoot pain.

## 2015-03-29 ENCOUNTER — Ambulatory Visit: Payer: BC Managed Care – PPO | Admitting: Cardiology

## 2015-04-25 ENCOUNTER — Other Ambulatory Visit: Payer: Self-pay | Admitting: Podiatry

## 2015-04-25 MED ORDER — NAPROXEN 375 MG PO TABS: 375.0000 mg | ORAL_TABLET | Freq: Two times a day (BID) | ORAL | Status: AC

## 2015-04-25 MED ORDER — OXYCODONE-ACETAMINOPHEN 7.5-325 MG PO TABS: 1.0000 | ORAL_TABLET | Freq: Four times a day (QID) | ORAL | Status: AC | PRN

## 2015-04-26 ENCOUNTER — Encounter: Payer: Self-pay | Admitting: *Deleted

## 2015-04-26 DIAGNOSIS — M2010 Hallux valgus (acquired), unspecified foot: Secondary | ICD-10-CM | POA: Diagnosis not present

## 2015-04-26 HISTORY — PX: BUNIONECTOMY: SHX129

## 2015-05-02 ENCOUNTER — Encounter: Payer: Self-pay | Admitting: Podiatry

## 2015-05-02 ENCOUNTER — Ambulatory Visit (INDEPENDENT_AMBULATORY_CARE_PROVIDER_SITE_OTHER): Payer: BC Managed Care – PPO | Admitting: Podiatry

## 2015-05-02 DIAGNOSIS — Z9889 Other specified postprocedural states: Secondary | ICD-10-CM

## 2015-05-02 NOTE — Progress Notes (Signed)
5 day S/P Right McBride bunionectomy (04/26/15) Intact and clean bandage without showing excess bleeding on surface. Been doing range of motion by her husband without difficulty. Objective: Good range of motion with minimum discomfort. Continue in surgical shoe. Ok to drive after practice in surgical shoe. Stay in one facility at work for the next few weeks.  Return in one week.

## 2015-05-02 NOTE — Patient Instructions (Signed)
S/P Right foot bunion surgery. Doing well with good range of motion. Continue daily motion exercise of the toe joint. Return in one week.

## 2015-05-09 ENCOUNTER — Ambulatory Visit (INDEPENDENT_AMBULATORY_CARE_PROVIDER_SITE_OTHER): Payer: BC Managed Care – PPO | Admitting: Podiatry

## 2015-05-09 ENCOUNTER — Encounter: Payer: Self-pay | Admitting: Podiatry

## 2015-05-09 DIAGNOSIS — Z9889 Other specified postprocedural states: Secondary | ICD-10-CM | POA: Insufficient documentation

## 2015-05-09 NOTE — Patient Instructions (Signed)
13 day post op wound healing well.  No edema or abnormal findings. Suture removed. Continue with range of motion exercise. Ok to wear regular tennis shoes as tolerated. Return in 2 weeks.

## 2015-05-09 NOTE — Progress Notes (Signed)
13 day S/P Right McBride bunionectomy (04/26/15) Has had some pain yesterday after been to a funeral service.  Been doing range of motion by her husband without difficulty. Objective: Good range of motion with minimum discomfort. Continue in surgical shoe. Ok to drive after practice in surgical shoe. Stay in one facility at work for the next few weeks.  Return in one week.

## 2015-05-17 ENCOUNTER — Encounter (HOSPITAL_BASED_OUTPATIENT_CLINIC_OR_DEPARTMENT_OTHER): Payer: Self-pay | Admitting: Emergency Medicine

## 2015-05-17 ENCOUNTER — Emergency Department (HOSPITAL_BASED_OUTPATIENT_CLINIC_OR_DEPARTMENT_OTHER)
Admission: EM | Admit: 2015-05-17 | Discharge: 2015-05-18 | Disposition: A | Discharge status: Home / Self Care | Payer: BC Managed Care – PPO | Attending: Emergency Medicine | Admitting: Emergency Medicine

## 2015-05-17 DIAGNOSIS — Z79899 Other long term (current) drug therapy: Secondary | ICD-10-CM | POA: Diagnosis not present

## 2015-05-17 DIAGNOSIS — Z8742 Personal history of other diseases of the female genital tract: Secondary | ICD-10-CM | POA: Insufficient documentation

## 2015-05-17 DIAGNOSIS — Z7982 Long term (current) use of aspirin: Secondary | ICD-10-CM | POA: Diagnosis not present

## 2015-05-17 DIAGNOSIS — Z8739 Personal history of other diseases of the musculoskeletal system and connective tissue: Secondary | ICD-10-CM | POA: Insufficient documentation

## 2015-05-17 DIAGNOSIS — Y9289 Other specified places as the place of occurrence of the external cause: Secondary | ICD-10-CM | POA: Diagnosis not present

## 2015-05-17 DIAGNOSIS — S86912A Strain of unspecified muscle(s) and tendon(s) at lower leg level, left leg, initial encounter: Secondary | ICD-10-CM | POA: Insufficient documentation

## 2015-05-17 DIAGNOSIS — Z9889 Other specified postprocedural states: Secondary | ICD-10-CM | POA: Diagnosis not present

## 2015-05-17 DIAGNOSIS — Y9389 Activity, other specified: Secondary | ICD-10-CM | POA: Insufficient documentation

## 2015-05-17 DIAGNOSIS — Z880 Allergy status to penicillin: Secondary | ICD-10-CM | POA: Diagnosis not present

## 2015-05-17 DIAGNOSIS — Z791 Long term (current) use of non-steroidal anti-inflammatories (NSAID): Secondary | ICD-10-CM | POA: Diagnosis not present

## 2015-05-17 DIAGNOSIS — Y998 Other external cause status: Secondary | ICD-10-CM | POA: Diagnosis not present

## 2015-05-17 DIAGNOSIS — Z8719 Personal history of other diseases of the digestive system: Secondary | ICD-10-CM | POA: Insufficient documentation

## 2015-05-17 DIAGNOSIS — J45909 Unspecified asthma, uncomplicated: Secondary | ICD-10-CM | POA: Insufficient documentation

## 2015-05-17 DIAGNOSIS — X58XXXA Exposure to other specified factors, initial encounter: Secondary | ICD-10-CM | POA: Insufficient documentation

## 2015-05-17 DIAGNOSIS — Z8632 Personal history of gestational diabetes: Secondary | ICD-10-CM | POA: Insufficient documentation

## 2015-05-17 DIAGNOSIS — S86812A Strain of other muscle(s) and tendon(s) at lower leg level, left leg, initial encounter: Secondary | ICD-10-CM

## 2015-05-17 DIAGNOSIS — S8992XA Unspecified injury of left lower leg, initial encounter: Secondary | ICD-10-CM | POA: Diagnosis present

## 2015-05-17 MED ORDER — HYDROCODONE-ACETAMINOPHEN 5-325 MG PO TABS: 1.0000 | ORAL_TABLET | Freq: Four times a day (QID) | ORAL | Status: AC | PRN

## 2015-05-17 NOTE — ED Provider Notes (Signed)
CSN: [id number]     Arrival date & time 05/17/15  2135 History  By signing my name below, I, [name], attest that this documentation has been prepared under the direction and in the presence of [name], MD. Electronically Signed: Judithann [name], ED Scribe. 05/17/2015. 11:49 PM.    Chief Complaint  Patient presents with  . Leg Pain   The history is provided by the patient. No language interpreter was used.   HPI Comments: Cassie Cap, PhD is a 40 y.o. female who presents to the Emergency Department complaining of moderate pain to the posterior aspect of left calf s/p shaking her leg vigorously tonight. She reports associated pain with ambulating. She explains that she recently had a bunionectomy on her right foot on 04/26/2015 so she was hopping on her left leg to let the dog out when she saw a mosquito on her left leg causing her to shake her leg where she heard a "pop". No alleviating factors noted. Pt has not tried any medications PTA. No fever, chills, or open wounds.    Past Medical History  Diagnosis Date  . Asthma     no inhaler  last "attack" 1999  . History of endometriosis   . GERD (gastroesophageal reflux disease)   . Gestational diabetes mellitus   . Uterine prolapse   . Abnormal nuclear stress test - Intermediate Risk with possible Anterior Ischemia 12/21/2013  . Bone spur of right foot 07/18/2013  . Bunion 07/18/2013  . Chest pain with moderate risk for cardiac etiology 12/11/2013  . Fatigue 12/11/2013  . Pain in lower limb 07/18/2013  . Right arm pain 01/08/2014   Past Surgical History  Procedure Laterality Date  . Cesarean section  2011  . Diagnostic laparoscopy  2003  . Svd       x 1  . Vaginal hysterectomy  06/18/2011    Procedure: HYSTERECTOMY VAGINAL;  Surgeon: Marvene Staff, MD;  Location: [facility];  Service: Gynecology;  Laterality: N/A;  and bilateral salpingectomy, vault suspension  . Cystoscopy  06/18/2011    Procedure: CYSTOSCOPY;   Surgeon: Reece Packer, MD;  Location: [facility];  Service: Urology;;  . Left heart catheterization with coronary angiogram N/A 12/22/2013    Procedure: LEFT HEART CATHETERIZATION WITH CORONARY ANGIOGRAM;  Surgeon: Leonie Man, MD;  Location: [facility] CATH LAB;  Service: Cardiovascular;  Laterality: N/A;  . Bunionectomy Right 04/26/2015    RT FOOT   Family History  Problem Relation Age of Onset  . Asthma Other   . Liver cancer    . Prostate cancer    . Lung cancer    . Bone cancer    . Kidney failure     Social History  Substance Use Topics  . Smoking status: Never Smoker   . Smokeless tobacco: Never Used  . Alcohol Use: No   OB History    No data available     Review of Systems  Constitutional: Negative for fever and chills.  Musculoskeletal: Positive for myalgias (left calf ).  Skin: Negative for wound.  All other systems reviewed and are negative.     Allergies  Penicillins  Home Medications   Prior to Admission medications   Medication Sig Start Date End Date Taking? Authorizing Provider  aspirin EC 81 MG tablet Take 1 tablet (81 mg total) by mouth daily. 12/20/13   [name], MD  azithromycin (ZITHROMAX) 250 MG tablet  11/26/14   Historical Provider, MD  chlorpheniramine-HYDROcodone (  TUSSIONEX) 10-8 MG/5ML SUER  11/26/14   Historical Provider, MD  ibuprofen (ADVIL,MOTRIN) 200 MG tablet Take 800 mg by mouth every 8 (eight) hours as needed. For pain    Historical Provider, MD  Ibuprofen-Famotidine (DUEXIS) 800-26.6 MG TABS Take 1 capsule by mouth 3 (three) times daily.     Historical Provider, MD  L-Methylfolate-B6-B12 (FOLTANX PO) Take 1 tablet by mouth daily.    Historical Provider, MD  lidocaine (LIDODERM) 5 % APPLY 1 PATCH ON THE SKIN Q 12 H AROUND THE CLOCK UTD 01/28/15   Historical Provider, MD  LORazepam (ATIVAN) 1 MG tablet Take 1 mg by mouth at bedtime.     Historical Provider, MD  naproxen (NAPROSYN) 375 MG tablet Take 1 tablet (375 mg total) by  mouth 2 (two) times daily. 04/25/15   [name], DPM  oxyCODONE-acetaminophen (PERCOCET) 7.5-325 MG tablet Take 1 tablet by mouth every 6 (six) hours as needed for severe pain. 04/25/15   [name], DPM  SAXENDA 18 MG/3ML SOPN Inject 1.8 mLs into the skin daily. 11/18/14   Historical Provider, MD  Vitamin D, Ergocalciferol, (DRISDOL) 50000 UNITS CAPS capsule Take 50,000 Units by mouth 2 (two) times a week.  07/07/13   Historical Provider, MD   BP 137/81 mmHg  Pulse 77  Temp(Src) 98.1 F (36.7 C) (Oral)  Resp 18  Ht 5\' 5"  (1.651 m)  Wt 210 lb (95.255 kg)  BMI 34.95 kg/m2  SpO2 100%  LMP 06/03/2011 Physical Exam  Constitutional: She is oriented to person, place, and time. She appears well-developed and well-nourished.  HENT:  Head: Normocephalic.  Eyes: EOM are normal.  Neck: Normal range of motion.  Pulmonary/Chest: Effort normal.  Abdominal: She exhibits no distension.  Musculoskeletal: Normal range of motion.  Left calf appears grossly normal. There is TTP over the posterior aspect of calf. She is able to dorsiflex and plantar flex the ankle. There is no swelling or edema of the leg.   Neurological: She is alert and oriented to person, place, and time.  Psychiatric: She has a normal mood and affect.  Nursing note and vitals reviewed.   ED Course  Procedures (including critical care time) DIAGNOSTIC STUDIES: Oxygen Saturation is 100% on RA, normal by my interpretation.    COORDINATION OF CARE: 11:45 PM- Pt advised of plan for treatment and pt agrees. Advised to ice area and keep it elevated. Recommended to follow up with PCP after one week if pain persists.   MDM   Final diagnoses:  None    Patient reports that this evening a bug had landed on her leg. She then shook her leg vigorously to shake the bug away. While doing this, she felt a pop in her calf and has reported severe pain since. Her physical examination is unremarkable. There is no significant swelling,  Thompson test is negative, and I have low suspicion for DVT. This is most likely a calf strain and possibly a ruptured Bakers cyst. Either way, she will be treated with anti-inflammatories, rest, and when necessary follow-up.  I personally performed the services described in this documentation, which was scribed in my presence. The recorded information has been reviewed and is accurate.       [name], MD 05/19/15 [phone]

## 2015-05-17 NOTE — Discharge Instructions (Signed)
Hydrocodone as prescribed as needed for pain.  Ice your leg for 20 minutes every 2 hours while awake for the next 2 days.  Keep your leg elevated.  Follow-up with your primary Dr. if not improving in the next week.   Muscle Strain A muscle strain is an injury that occurs when a muscle is stretched beyond its normal length. Usually a small number of muscle fibers are torn when this happens. Muscle strain is rated in degrees. First-degree strains have the least amount of muscle fiber tearing and pain. Second-degree and third-degree strains have increasingly more tearing and pain.  Usually, recovery from muscle strain takes 1-2 weeks. Complete healing takes 5-6 weeks.  CAUSES  Muscle strain happens when a sudden, violent force placed on a muscle stretches it too far. This may occur with lifting, sports, or a fall.  RISK FACTORS Muscle strain is especially common in athletes.  SIGNS AND SYMPTOMS At the site of the muscle strain, there may be:  Pain.  Bruising.  Swelling.  Difficulty using the muscle due to pain or lack of normal function. DIAGNOSIS  Your health care provider will perform a physical exam and ask about your medical history. TREATMENT  Often, the best treatment for a muscle strain is resting, icing, and applying cold compresses to the injured area.  HOME CARE INSTRUCTIONS   Use the PRICE method of treatment to promote muscle healing during the first 2-3 days after your injury. The PRICE method involves:  Protecting the muscle from being injured again.  Restricting your activity and resting the injured body part.  Icing your injury. To do this, put ice in a plastic bag. Place a towel between your skin and the bag. Then, apply the ice and leave it on from 15-20 minutes each hour. After the third day, switch to moist heat packs.  Apply compression to the injured area with a splint or elastic bandage. Be careful not to wrap it too tightly. This may interfere with blood  circulation or increase swelling.  Elevate the injured body part above the level of your heart as often as you can.  Only take over-the-counter or prescription medicines for pain, discomfort, or fever as directed by your health care provider.  Warming up prior to exercise helps to prevent future muscle strains. SEEK MEDICAL CARE IF:   You have increasing pain or swelling in the injured area.  You have numbness, tingling, or a significant loss of strength in the injured area. MAKE SURE YOU:   Understand these instructions.  Will watch your condition.  Will get help right away if you are not doing well or get worse.   This information is not intended to replace advice given to you by your health care provider. Make sure you discuss any questions you have with your health care provider.   Document Released: 02/02/2005 Document Revised: 11/23/2012 Document Reviewed: 09/01/2012 Elsevier Interactive Patient Education [employer].

## 2015-05-17 NOTE — ED Notes (Signed)
Pt has boot on right leg and hopped to kitchen with other leg and hurt in the process.

## 2015-05-23 ENCOUNTER — Encounter: Payer: BC Managed Care – PPO | Admitting: Podiatry

## 2015-05-23 ENCOUNTER — Ambulatory Visit (INDEPENDENT_AMBULATORY_CARE_PROVIDER_SITE_OTHER): Payer: BC Managed Care – PPO | Admitting: Podiatry

## 2015-05-23 ENCOUNTER — Encounter: Payer: Self-pay | Admitting: Podiatry

## 2015-05-23 DIAGNOSIS — M205X1 Other deformities of toe(s) (acquired), right foot: Secondary | ICD-10-CM

## 2015-05-23 DIAGNOSIS — M779 Enthesopathy, unspecified: Secondary | ICD-10-CM | POA: Diagnosis not present

## 2015-05-23 DIAGNOSIS — M79604 Pain in right leg: Secondary | ICD-10-CM

## 2015-05-23 DIAGNOSIS — M7751 Other enthesopathy of right foot: Secondary | ICD-10-CM

## 2015-05-23 NOTE — Patient Instructions (Signed)
4 weeks post op wound following McBride bunionectomy. Joint is moving well with minimum discomfort. May try regular tennis shoes as tolerated. Recent left tendon incident injury appears minimum. Do daily stretch exercise.

## 2015-05-23 NOTE — Progress Notes (Signed)
4 weeks S/P Right McBride bunionectomy (04/26/15) Did well with right foot.  Last week had an incident hurting herself while moving the leg, shaking off spider. She felt snap and her left posterior leg near the start hurt. She was examined at ER and was told she might have soft tear of tendon sheet. Able to walk with aid of crutches.  DF and PF at the ankle joint left foot is normal in strength. No visible edema or discoloration noted. Possible soft tissue injury to Plantaris tendon.  Right foot is doing well with minimum discomfort.  Radiographic examination reveal smooth metatarsal head with absence of irregular bone spur, and has clean joint space. Finding is consistent with surgery performed.   Ok to wear regular shoes. Return as needed.

## 2020-05-02 ENCOUNTER — Other Ambulatory Visit: Payer: Self-pay

## 2020-05-02 ENCOUNTER — Encounter: Payer: Self-pay | Admitting: Sports Medicine

## 2020-05-02 ENCOUNTER — Ambulatory Visit (INDEPENDENT_AMBULATORY_CARE_PROVIDER_SITE_OTHER): Payer: BC Managed Care – PPO

## 2020-05-02 ENCOUNTER — Ambulatory Visit: Payer: BC Managed Care – PPO | Admitting: Sports Medicine

## 2020-05-02 DIAGNOSIS — G8929 Other chronic pain: Secondary | ICD-10-CM

## 2020-05-02 DIAGNOSIS — M79671 Pain in right foot: Secondary | ICD-10-CM | POA: Diagnosis not present

## 2020-05-02 DIAGNOSIS — M25572 Pain in left ankle and joints of left foot: Secondary | ICD-10-CM

## 2020-05-02 DIAGNOSIS — E559 Vitamin D deficiency, unspecified: Secondary | ICD-10-CM | POA: Insufficient documentation

## 2020-05-02 DIAGNOSIS — M2141 Flat foot [pes planus] (acquired), right foot: Secondary | ICD-10-CM

## 2020-05-02 DIAGNOSIS — M722 Plantar fascial fibromatosis: Secondary | ICD-10-CM

## 2020-05-02 DIAGNOSIS — M25571 Pain in right ankle and joints of right foot: Secondary | ICD-10-CM

## 2020-05-02 DIAGNOSIS — M2142 Flat foot [pes planus] (acquired), left foot: Secondary | ICD-10-CM | POA: Diagnosis not present

## 2020-05-02 DIAGNOSIS — E669 Obesity, unspecified: Secondary | ICD-10-CM | POA: Insufficient documentation

## 2020-05-02 DIAGNOSIS — M797 Fibromyalgia: Secondary | ICD-10-CM | POA: Insufficient documentation

## 2020-05-02 DIAGNOSIS — M79672 Pain in left foot: Secondary | ICD-10-CM

## 2020-05-02 MED ORDER — MELOXICAM 15 MG PO TABS: 15.0000 mg | ORAL_TABLET | Freq: Every day | ORAL | 0 refills | Status: AC

## 2020-05-02 NOTE — Progress Notes (Signed)
Subjective: [name], PhD is a 45 y.o. female patient presents to office with complaint of pain to the bottoms of both feet for the last 8 months reports that she had surgery on the right foot 9 years ago at her big toe joint and states that over the last 8 months pain has gotten worse in the bottom as well as the lateral foot and ankle reports that her right ankle feels very unstable feels like she is going to a fall and has had at least 2-3 trips and falls in 1 year patient reports that she also has fibromyalgia and is wondering if this is related.  Review of Systems  All other systems reviewed and are negative.    Patient Active Problem List   Diagnosis Date Noted  . Fibromyalgia 05/02/2020  . Obesity 05/02/2020  . Vitamin D deficiency 05/02/2020  . Status post right foot surgery 05/09/2015  . Right arm pain 01/08/2014  . Abnormal nuclear stress test - Intermediate Risk with possible Anterior Ischemia 12/21/2013  . Chest pain with moderate risk for cardiac etiology 12/11/2013  . Fatigue 12/11/2013  . Bunion 07/18/2013  . Bone spur of right foot 07/18/2013  . Pain in lower limb 07/18/2013    Current Outpatient Medications on File Prior to Visit  Medication Sig Dispense Refill  . albuterol (VENTOLIN HFA) 108 (90 Base) MCG/ACT inhaler Inhale into the lungs.    Marland Kitchen amitriptyline (ELAVIL) 10 MG tablet amitriptyline 10 mg tablet  TAKE 1 TABLET BY MOUTH AT BEDTIME    . chlorpheniramine-HYDROcodone (TUSSIONEX) 10-8 MG/5ML SUER     . Cholecalciferol 125 MCG (5000 UT) TABS cholecalciferol (vitamin D3) 125 mcg (5,000 unit) tablet    . cyanocobalamin (,VITAMIN B-12,) 1000 MCG/ML injection SMARTSIG:1 Milliliter(s) SUB-Q Once a Week    . fluticasone (FLONASE) 50 MCG/ACT nasal spray Place into the nose.    . gabapentin (NEURONTIN) 300 MG capsule gabapentin 300 mg capsule    . ibuprofen (ADVIL,MOTRIN) 200 MG tablet Take 800 mg by mouth every 8 (eight) hours as needed. For pain    . ID  NOW COVID-19 KIT TEST AS DIRECTED TODAY    . L-Methylfolate-B6-B12 (FOLTANX PO) Take 1 tablet by mouth daily.    Marland Kitchen LORazepam (ATIVAN) 1 MG tablet Take 1 mg by mouth at bedtime.     . nabumetone (RELAFEN) 750 MG tablet Take by mouth.    . prednisoLONE acetate (PRED FORTE) 1 % ophthalmic suspension prednisolone acetate 1 % eye drops,suspension  SHAKE LIQUID AND INSTILL 1 DROP IN BOTH EYES TWICE DAILY    . predniSONE (STERAPRED UNI-PAK 21 TAB) 10 MG (21) TBPK tablet Take as directed on dose pack for 6 days.    Marland Kitchen senna-docusate (SENOKOT-S) 8.6-50 MG tablet Take by mouth.    . sucralfate (CARAFATE) 1 g tablet Take by mouth.    . Vitamin D, Ergocalciferol, (DRISDOL) 50000 UNITS CAPS capsule Take 50,000 Units by mouth 2 (two) times a week.     Marland Kitchen aspirin EC 81 MG tablet Take 1 tablet (81 mg total) by mouth daily. (Patient not taking: Reported on 05/02/2020) 90 tablet 0  . azithromycin (ZITHROMAX) 250 MG tablet  (Patient not taking: Reported on 05/02/2020)    . famotidine (PEPCID) 20 MG tablet Take by mouth. (Patient not taking: Reported on 05/02/2020)    . HYDROcodone-acetaminophen (NORCO) 5-325 MG tablet Take 1-2 tablets by mouth every 6 (six) hours as needed. (Patient not taking: Reported on 05/02/2020) 15 tablet 0  .  Ibuprofen-Famotidine 800-26.6 MG TABS Take 1 capsule by mouth 3 (three) times daily.  (Patient not taking: Reported on 05/02/2020)    . lidocaine (LIDODERM) 5 % APPLY 1 PATCH ON THE SKIN Q 12 H AROUND THE CLOCK UTD (Patient not taking: Reported on 05/02/2020)  0  . naproxen (NAPROSYN) 375 MG tablet Take 1 tablet (375 mg total) by mouth 2 (two) times daily. (Patient not taking: Reported on 05/02/2020) 60 tablet 1  . oxyCODONE-acetaminophen (PERCOCET) 7.5-325 MG tablet Take 1 tablet by mouth every 6 (six) hours as needed for severe pain. (Patient not taking: Reported on 05/02/2020) 60 tablet 0  . SAXENDA 18 MG/3ML SOPN Inject 1.8 mLs into the skin daily. (Patient not taking: Reported on 05/02/2020)  5    No current facility-administered medications on file prior to visit.    Allergies  Allergen Reactions  . Penicillins Other (See Comments)    Childhood reaction. Has tolerated Amoxicillin    Objective: Physical Exam General: The patient is alert and oriented x3 in no acute distress.  Dermatology: Skin is warm, dry and supple bilateral lower extremities. Nails 1-10 are normal. There is no erythema, edema, no eccymosis, no open lesions present. Integument is otherwise unremarkable.  Old surgical scars well-healed.  Vascular: Dorsalis Pedis pulse and Posterior Tibial pulse are 2/4 bilateral. Capillary fill time is immediate to all digits.  Neurological: Grossly intact to light touch bilateral.  Musculoskeletal: Tenderness to palpation at the medial calcaneal tubercale and through the insertion of the plantar fascia on the left and right foot.  There is diffuse pain to the lateral ankles bilateral.  No obvious ankle instability however subjectively ankles feel weak right greater than left with history of roller sprain. All other joints range of motion within normal limits bilateral except at first MPJ on the right previous history of surgery. Strength 5/5 in all groups bilateral.  Pes planus foot type bilateral.  Gait: Unassisted, Antalgic  Xray, Right/Left foot:  Normal osseous mineralization. Joint spaces preserved except at midfoot supportive of breech and pes planus deformity. No fracture/dislocation/boney destruction. Calcaneal spur present with mild thickening of plantar fascia. No other soft tissue abnormalities or radiopaque foreign bodies.   Assessment and Plan: Problem List Items Addressed This Visit      Other   Fibromyalgia   Relevant Medications   amitriptyline (ELAVIL) 10 MG tablet   gabapentin (NEURONTIN) 300 MG capsule   nabumetone (RELAFEN) 750 MG tablet   predniSONE (STERAPRED UNI-PAK 21 TAB) 10 MG (21) TBPK tablet   meloxicam (MOBIC) 15 MG tablet    Other Visit  Diagnoses    Pain in both feet    -  Primary   Relevant Orders   DG Foot Complete Right   DG Foot Complete Left   DG Ankle 2 Views Left   DG Ankle 2 Views Right   Plantar fasciitis       Pes planus of both feet       Chronic pain of right ankle       Relevant Medications   amitriptyline (ELAVIL) 10 MG tablet   gabapentin (NEURONTIN) 300 MG capsule   nabumetone (RELAFEN) 750 MG tablet   predniSONE (STERAPRED UNI-PAK 21 TAB) 10 MG (21) TBPK tablet   meloxicam (MOBIC) 15 MG tablet   Chronic pain of left ankle       Relevant Medications   amitriptyline (ELAVIL) 10 MG tablet   gabapentin (NEURONTIN) 300 MG capsule   nabumetone (RELAFEN) 750 MG tablet  predniSONE (STERAPRED UNI-PAK 21 TAB) 10 MG (21) TBPK tablet   meloxicam (MOBIC) 15 MG tablet      -Complete examination performed.  -Xrays reviewed -Discussed with patient in detail the condition of plantar fasciitis chronic ankle pain and instability, how this occurs and general treatment options. Explained both conservative and surgical treatments.  -Rx Mobic to take as instructed  -Dispensed fascial braces to use as instructed -Explained and dispensed to patient daily stretching exercises. -Recommend patient to ice affected area 1-2x daily. -Patient to return to office in 4 weeks for follow up or sooner if problems or questions arise.  Landis Martins, DPM

## 2020-05-08 ENCOUNTER — Other Ambulatory Visit: Payer: Self-pay | Admitting: Sports Medicine

## 2020-05-08 DIAGNOSIS — M2141 Flat foot [pes planus] (acquired), right foot: Secondary | ICD-10-CM

## 2020-05-30 ENCOUNTER — Ambulatory Visit: Payer: BC Managed Care – PPO | Admitting: Sports Medicine

## 2020-07-04 ENCOUNTER — Ambulatory Visit: Payer: BC Managed Care – PPO | Admitting: Sports Medicine

## 2020-12-08 ENCOUNTER — Emergency Department (HOSPITAL_BASED_OUTPATIENT_CLINIC_OR_DEPARTMENT_OTHER): Payer: BC Managed Care – PPO

## 2020-12-08 ENCOUNTER — Emergency Department (HOSPITAL_BASED_OUTPATIENT_CLINIC_OR_DEPARTMENT_OTHER)
Admission: EM | Admit: 2020-12-08 | Discharge: 2020-12-08 | Disposition: A | Discharge status: Home / Self Care | Payer: BC Managed Care – PPO | Attending: Emergency Medicine | Admitting: Emergency Medicine

## 2020-12-08 ENCOUNTER — Encounter (HOSPITAL_BASED_OUTPATIENT_CLINIC_OR_DEPARTMENT_OTHER): Payer: Self-pay | Admitting: Emergency Medicine

## 2020-12-08 ENCOUNTER — Other Ambulatory Visit: Payer: Self-pay

## 2020-12-08 DIAGNOSIS — Z955 Presence of coronary angioplasty implant and graft: Secondary | ICD-10-CM | POA: Diagnosis not present

## 2020-12-08 DIAGNOSIS — Z7951 Long term (current) use of inhaled steroids: Secondary | ICD-10-CM | POA: Diagnosis not present

## 2020-12-08 DIAGNOSIS — J45909 Unspecified asthma, uncomplicated: Secondary | ICD-10-CM | POA: Insufficient documentation

## 2020-12-08 DIAGNOSIS — Z20822 Contact with and (suspected) exposure to covid-19: Secondary | ICD-10-CM | POA: Insufficient documentation

## 2020-12-08 DIAGNOSIS — Z7982 Long term (current) use of aspirin: Secondary | ICD-10-CM | POA: Diagnosis not present

## 2020-12-08 DIAGNOSIS — R059 Cough, unspecified: Secondary | ICD-10-CM | POA: Diagnosis present

## 2020-12-08 DIAGNOSIS — J101 Influenza due to other identified influenza virus with other respiratory manifestations: Secondary | ICD-10-CM | POA: Diagnosis not present

## 2020-12-08 LAB — RESP PANEL BY RT-PCR (FLU A&B, COVID) ARPGX2
Influenza A by PCR: POSITIVE — AB
Influenza B by PCR: NEGATIVE
SARS Coronavirus 2 by RT PCR: NEGATIVE

## 2020-12-08 MED ORDER — GUAIFENESIN 100 MG/5ML PO LIQD: 100.0000 mg | ORAL | 0 refills | Status: AC | PRN

## 2020-12-08 MED ORDER — DEXTROMETHORPHAN POLISTIREX ER 30 MG/5ML PO SUER: 30.0000 mg | Freq: Once | ORAL | Status: DC

## 2020-12-08 MED ORDER — BENZONATATE 100 MG PO CAPS
200.0000 mg | ORAL_CAPSULE | Freq: Once | ORAL | Status: AC
  Administered 2020-12-08: 200 mg via ORAL
  Filled 2020-12-08: qty 2

## 2020-12-08 NOTE — Discharge Instructions (Signed)
Read the instructions below on reasons to return to the emergency department and to learn more about your diagnosis.  Use over the counter medications for symptomatic relief as we discussed (musinex as a decongestant, Tylenol for fever/pain, Motrin/Ibuprofen for muscle aches). If prescribed a cough suppressant during your visit, do not operate heavy machinery with in 5 hours of taking this medication. Followup with your primary care doctor in 4 days if your symptoms persist.  Your more than welcome to return to the emergency department if symptoms worsen or become concerning.  Upper Respiratory Infection, Adult  An upper respiratory infection (URI) is also sometimes known as the common cold. Most people improve within 1 week, but symptoms can last up to 2 weeks. A residual cough may last even longer.   URI is most commonly caused by a virus. Viruses are NOT treated with antibiotics. You can easily spread the virus to others by oral contact. This includes kissing, sharing a glass, coughing, or sneezing. Touching your mouth or nose and then touching a surface, which is then touched by another person, can also spread the virus.   TREATMENT  Treatment is directed at relieving symptoms. There is no cure. Antibiotics are not effective, because the infection is caused by a virus, not by bacteria. Treatment may include:  Increased fluid intake. Sports drinks offer valuable electrolytes, sugars, and fluids.  Breathing heated mist or steam (vaporizer or shower).  Eating chicken soup or other clear broths, and maintaining good nutrition.  Getting plenty of rest.  Using gargles or lozenges for comfort.  Controlling fevers with ibuprofen or acetaminophen as directed by your caregiver.  Increasing usage of your inhaler if you have asthma.  Return to work when your temperature has returned to normal.   SEEK MEDICAL CARE IF:  After the first few days, you feel you are getting worse rather than better.  You  develop worsening shortness of breath, or brown or red sputum. These may be signs of pneumonia.  You develop yellow or brown nasal discharge or pain in the face, especially when you bend forward. These may be signs of sinusitis.  You develop a fever, swollen neck glands, pain with swallowing, or white areas in the back of your throat. These may be signs of strep throat.

## 2020-12-08 NOTE — ED Triage Notes (Signed)
Pt reports son diagnosed with flu Monday. Pt currently having cough and runny nose onset Thursday.

## 2020-12-08 NOTE — ED Provider Notes (Signed)
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 Presents with 4 days of cough, congestion, fever, body aches. She states that she initially had body aches on Thursday with associated fever at that time.  She has not had repeat fever or body aches since then.  Her cough has gradually worsened and she is coughing up a lot of phlegm.  She describes it to be yellow to brown color.  She also has some rhinorrhea and nasal congestion.  Denies sore throat.  She feels some chest tightness like her airways are congested.  Her son was diagnosed with the flu last Monday, so she is concerned that she has what he had.  She feels that she needs something for her cough, because it is so uncomfortable.  She denies any respiratory distress, shortness of breath, chest pain, nausea, vomiting, abdominal pain.   Cough Associated symptoms: chills, fever and rhinorrhea   Associated symptoms: no chest pain, no ear pain, no headaches, no rash, no shortness of breath and no sore throat       Past Medical History:  Diagnosis Date   Abnormal nuclear stress test - Intermediate Risk with possible Anterior Ischemia 12/21/2013   Asthma    no inhaler  last "attack" 1999   Bone spur of right foot 07/18/2013   Bunion 07/18/2013   Chest pain with moderate risk for cardiac etiology 12/11/2013   Fatigue 12/11/2013   GERD (gastroesophageal reflux disease)    Gestational diabetes mellitus    History of endometriosis    Pain in lower limb 07/18/2013   Right arm pain 01/08/2014   Uterine prolapse     [name] Active Problem List   Diagnosis Date Noted   Fibromyalgia 05/02/2020   Obesity 05/02/2020   Vitamin D deficiency 05/02/2020   Status post right foot surgery 05/09/2015   Right arm pain 01/08/2014   Abnormal nuclear stress test - Intermediate Risk  with possible Anterior Ischemia 12/21/2013   Chest pain with moderate risk for cardiac etiology 12/11/2013   Fatigue 12/11/2013   Bunion 07/18/2013   Bone spur of right foot 07/18/2013   Pain in lower limb 07/18/2013    Past Surgical History:  Procedure Laterality Date   BUNIONECTOMY Right 04/26/2015   RT FOOT   CESAREAN SECTION  2011   CYSTOSCOPY  06/18/2011   Procedure: CYSTOSCOPY;  Surgeon: Reece Packer, MD;  Location: [facility];  Service: Urology;;   DIAGNOSTIC LAPAROSCOPY  2003   LEFT HEART CATHETERIZATION WITH CORONARY ANGIOGRAM N/A 12/22/2013   Procedure: LEFT HEART CATHETERIZATION WITH CORONARY ANGIOGRAM;  Surgeon: Leonie Man, MD;  Location: [facility] CATH LAB;  Service: Cardiovascular;  Laterality: N/A;   svd      x 1   VAGINAL HYSTERECTOMY  06/18/2011   Procedure: HYSTERECTOMY VAGINAL;  Surgeon: Marvene Staff, MD;  Location: [facility];  Service: Gynecology;  Laterality: N/A;  and bilateral salpingectomy, vault suspension     OB History   No obstetric history on file.     Family History  Problem Relation Age of Onset   Asthma Other    Liver cancer Other    Prostate cancer Other    Lung cancer Other    Bone cancer Other    Kidney failure Other     Social History  Tobacco Use   Smoking status: Never   Smokeless tobacco: Never  Vaping Use   Vaping Use: Never used  Substance Use Topics   Alcohol use: No    Alcohol/week: 0.0 standard drinks   Drug use: No    Home Medications Prior to Admission medications   Medication Sig Start Date End Date Taking? Authorizing Provider  guaiFENesin (ROBITUSSIN) 100 MG/5ML liquid Take 5-10 mLs (100-200 mg total) by mouth every 4 (four) hours as needed for up to 5 days for cough or to loosen phlegm. 12/08/20 12/13/20 Yes Loeffler, Adora Fridge, PA-C  albuterol (VENTOLIN HFA) 108 (90 Base) MCG/ACT inhaler Inhale into the lungs. 03/23/20 03/23/21  [provider]  amitriptyline (ELAVIL) 10 MG tablet amitriptyline 10 mg  tablet  TAKE 1 TABLET BY MOUTH AT BEDTIME 09/21/19   [provider]  aspirin EC 81 MG tablet Take 1 tablet (81 mg total) by mouth daily. [name] not taking: Reported on 05/02/2020 12/20/13   [name], MD  azithromycin [facility]) 250 MG tablet  11/26/14   [provider]  chlorpheniramine-HYDROcodone ([place]) 10-8 MG/5ML SUER  11/26/14   [provider]  Cholecalciferol 125 MCG (5000 UT) TABS cholecalciferol (vitamin D3) 125 mcg (5,000 unit) tablet 11/02/18   [provider]  cyanocobalamin (,VITAMIN B-12,) 1000 MCG/ML injection SMARTSIG:1 Milliliter(s) SUB-Q Once a Week 12/15/19   [provider]  famotidine (PEPCID) 20 MG tablet Take by mouth. [name] not taking: Reported on 05/02/2020 04/21/18   [provider]  fluticasone (FLONASE) 50 MCG/ACT nasal spray Place into the nose. 02/18/20   [provider]  gabapentin (NEURONTIN) 300 MG capsule gabapentin 300 mg capsule 10/16/19   [provider]  HYDROcodone-acetaminophen (NORCO) 5-325 MG tablet Take 1-2 tablets by mouth every 6 (six) hours as needed. [name] not taking: Reported on 05/02/2020 05/17/15   [name], MD  ibuprofen (ADVIL,MOTRIN) 200 MG tablet Take 800 mg by mouth every 8 (eight) hours as needed. For pain    [provider]  Ibuprofen-Famotidine 800-26.6 MG TABS Take 1 capsule by mouth 3 (three) times daily.  [name] not taking: Reported on 05/02/2020    [provider]  ID NOW COVID-19 KIT TEST AS DIRECTED TODAY 02/03/20   [provider]  L-Methylfolate-B6-B12 (FOLTANX PO) Take 1 tablet by mouth daily.    [provider]  lidocaine (LIDODERM) 5 % APPLY 1 PATCH ON THE SKIN Q 12 H AROUND THE CLOCK UTD [name] not taking: Reported on 05/02/2020 01/28/15   [provider]  LORazepam (ATIVAN) 1 MG tablet Take 1 mg by mouth at bedtime.     [provider]  meloxicam (MOBIC) 15 MG tablet Take 1 tablet (15 mg  total) by mouth daily. 05/02/20   Landis Martins, DPM  nabumetone (RELAFEN) 750 MG tablet Take by mouth. 03/03/18   [provider]  naproxen (NAPROSYN) 375 MG tablet Take 1 tablet (375 mg total) by mouth 2 (two) times daily. [name] not taking: Reported on 05/02/2020 04/25/15   Sheard, Myeong O, DPM  oxyCODONE-acetaminophen (PERCOCET) 7.5-325 MG tablet Take 1 tablet by mouth every 6 (six) hours as needed for severe pain. [name] not taking: Reported on 05/02/2020 04/25/15   Sheard, Myeong O, DPM  prednisoLONE acetate (PRED FORTE) 1 % ophthalmic suspension prednisolone acetate 1 % eye drops,suspension  SHAKE LIQUID AND INSTILL 1 DROP IN BOTH EYES TWICE DAILY    [provider]  predniSONE (STERAPRED UNI-PAK 21 TAB) 10 MG (21) TBPK tablet Take  as directed on dose pack for 6 days. 02/18/20   [provider]  SAXENDA 18 MG/3ML SOPN Inject 1.8 mLs into the skin daily. [name] not taking: Reported on 05/02/2020 11/18/14   [provider]  senna-docusate (SENOKOT-S) 8.6-50 MG tablet Take by mouth. 03/03/18   [provider]  sucralfate (CARAFATE) 1 g tablet Take by mouth. 04/21/18   [provider]  Vitamin D, Ergocalciferol, (DRISDOL) 50000 UNITS CAPS capsule Take 50,000 Units by mouth 2 (two) times a week.  07/07/13   [provider]    Allergies    Penicillins  Review of Systems   Review of Systems  Constitutional:  Positive for chills and fever.  HENT:  Positive for congestion and rhinorrhea. Negative for ear discharge, ear pain, sinus pressure, sinus pain and sore throat.   Eyes:  Negative for visual disturbance.  Respiratory:  Positive for cough and chest tightness. Negative for shortness of breath.   Cardiovascular:  Negative for chest pain, palpitations and leg swelling.  Gastrointestinal:  Negative for abdominal pain, blood in stool, constipation, diarrhea, nausea and vomiting.  Genitourinary:  Negative for dysuria, flank pain and  hematuria.  Musculoskeletal:  Negative for back pain.  Skin:  Negative for rash and wound.  Neurological:  Negative for dizziness, syncope, weakness, light-headedness and headaches.  Psychiatric/Behavioral:  Negative for confusion.   All other systems reviewed and are negative.  Physical Exam Updated Vital Signs BP 110/66 (BP Location: Right Arm)   Pulse 72   Temp 98.2 F (36.8 C) (Oral)   Resp 18   Ht 5' 5" (1.651 m)   Wt 103.9 kg   LMP 06/04/2011   SpO2 98%   BMI 38.11 kg/m   Physical Exam Vitals and nursing note reviewed.  Constitutional:      General: She is not in acute distress.    Appearance: Normal appearance. She is well-developed. She is not ill-appearing, toxic-appearing or diaphoretic.  HENT:     Head: Normocephalic and atraumatic.     Nose: Congestion and rhinorrhea present. No nasal deformity.     Mouth/Throat:     Lips: Pink. No lesions.     Mouth: Mucous membranes are moist.     Pharynx: Oropharynx is clear. No oropharyngeal exudate or posterior oropharyngeal erythema.  Eyes:     General: Gaze aligned appropriately. No scleral icterus.       Right eye: No discharge.        Left eye: No discharge.     Conjunctiva/sclera: Conjunctivae normal.     Right eye: Right conjunctiva is not injected. No exudate or hemorrhage.    Left eye: Left conjunctiva is not injected. No exudate or hemorrhage. Cardiovascular:     Rate and Rhythm: Normal rate and regular rhythm.     Pulses: Normal pulses.     Heart sounds: Normal heart sounds. No murmur heard.   No friction rub. No gallop.  Pulmonary:     Effort: Pulmonary effort is normal. No respiratory distress.     Breath sounds: Normal breath sounds. No stridor. No wheezing, rhonchi or rales.     Comments: Lung sounds are clear to auscultation bilaterally.  No signs of congestion or wheezing. Chest:     Chest wall: No tenderness.  Abdominal:     General: Abdomen is flat. Bowel sounds are normal. There is no distension.      Palpations: Abdomen is soft.     Tenderness: There is no abdominal tenderness. There is no guarding  or rebound.  Musculoskeletal:     Cervical back: Neck supple.  Lymphadenopathy:     Cervical: No cervical adenopathy.  Skin:    General: Skin is warm and dry.     Findings: No rash.  Neurological:     Mental Status: She is alert and oriented to person, place, and time.  Psychiatric:        Mood and Affect: Mood normal.        Speech: Speech normal.        Behavior: Behavior normal. Behavior is cooperative.    Cassie Results / Procedures / Treatments   Labs (all labs ordered are listed, but only abnormal results are displayed) Labs Reviewed  RESP PANEL BY RT-PCR (FLU A&B, COVID) ARPGX2 - Abnormal; Notable for the following components:      Result Value   Influenza A by PCR POSITIVE (*)    All other components within normal limits    EKG None  Radiology DG Chest 2 View  Result Date: 12/08/2020 CLINICAL DATA:  Chest tightness, congestion. EXAM: CHEST - 2 VIEW COMPARISON:  Chest radiograph dated 04/21/2018. FINDINGS: The heart size and mediastinal contours are within normal limits. Both lungs are clear. The visualized skeletal structures are unremarkable. IMPRESSION: No active cardiopulmonary disease. Electronically Signed   By: Zerita Boers M.D.   On: 12/08/2020 12:54    Procedures Procedures   Medications Ordered in Cassie Medications  benzonatate (TESSALON) capsule 200 mg (200 mg Oral Given 12/08/20 1320)    Cassie Course  I have reviewed the triage vital signs and the nursing notes.  Pertinent labs & imaging results that were available during my care of the [name] were reviewed by me and considered in my medical decision making (see chart for details).  Clinical Course as of 12/08/20 1526  Sun Dec 08, 2020  1236 Will obtain COVID, flu, chest x-ray given [name]'s complaints of chest tightness and upper airway congestion.  Low suspicion for pneumonia.  Will give dose of  cough medication while in Cassie. [GL]  4627 Influenza +, out of window for tamiflu, d/c on guaifenesin cough med [GL]    Clinical Course User Index [GL] Loeffler, Adora Fridge, PA-C   MDM Rules/Calculators/A&P                         [name] presents with 4 days of upper respiratory symptoms and body aches.  She has had contact with someone with influenza.  She has a pretty bad cough that is interfering with her daily life. Vitals are stable.  Oxygenating well on room air.  Afebrile. Exam with lungs clear to auscultation.  Appears to be in no acute distress.  Productive cough heard.  Overall reassuring. 1 dose of Tessalon Perles while in the emergency department to help with cough. [name] tested positive for flu A.  She is out of the window for Tamiflu.  She is requesting cough medication upon discharge.  Refuses dextromethorphan.  Recommend Guaifenesin for cough sx. not recommend antibiotics. I discussed this with the [name] and treatment plan going forward.  Return precautions given if worsening respiratory symptoms.  Final Clinical Impression(s) / Cassie Diagnoses Final diagnoses:  Influenza A    Rx / DC Orders Cassie Discharge Orders          Ordered    guaiFENesin (ROBITUSSIN) 100 MG/5ML liquid  Every 4 hours PRN        12/08/20 1348  Adolphus Birchwood, PA-C 12/08/20 1526    Malvin Johns, MD 12/08/20 1534

## 2022-05-08 ENCOUNTER — Ambulatory Visit (INDEPENDENT_AMBULATORY_CARE_PROVIDER_SITE_OTHER): Payer: BC Managed Care – PPO

## 2022-05-08 ENCOUNTER — Ambulatory Visit: Payer: BC Managed Care – PPO | Admitting: Podiatry

## 2022-05-08 DIAGNOSIS — M722 Plantar fascial fibromatosis: Secondary | ICD-10-CM

## 2022-05-08 DIAGNOSIS — M79672 Pain in left foot: Secondary | ICD-10-CM

## 2022-05-08 DIAGNOSIS — M205X9 Other deformities of toe(s) (acquired), unspecified foot: Secondary | ICD-10-CM

## 2022-05-08 DIAGNOSIS — M7752 Other enthesopathy of left foot: Secondary | ICD-10-CM

## 2022-05-08 DIAGNOSIS — M79671 Pain in right foot: Secondary | ICD-10-CM

## 2022-05-08 MED ORDER — DICLOFENAC SODIUM 75 MG PO TBEC: 75.0000 mg | DELAYED_RELEASE_TABLET | Freq: Two times a day (BID) | ORAL | 2 refills | Status: AC

## 2022-05-08 MED ORDER — TRIAMCINOLONE ACETONIDE 10 MG/ML IJ SUSP
20.0000 mg | Freq: Once | INTRAMUSCULAR | Status: AC
  Administered 2022-05-08: 20 mg

## 2022-05-08 NOTE — Patient Instructions (Signed)

## 2022-05-09 NOTE — Progress Notes (Signed)
Subjective:   Patient ID: Cassie Cap, PhD, female   DOB: 47 y.o.   MRN: [id number]   HPI Patient states she has been having a lot of discomfort around her big toe joint of both feet which has been a gradual increase and has significant discomfort in the plantar heel left.  States the left hurts more than the right and she did have surgery a number years ago on the right to remove a bone spur from the right big toe joint.  Patient does not smoke likes to be active   Review of Systems  All other systems reviewed and are negative.       Objective:  Physical Exam Vitals and nursing note reviewed.  Constitutional:      Appearance: She is well-developed.  Pulmonary:     Effort: Pulmonary effort is normal.  Musculoskeletal:        General: Normal range of motion.  Skin:    General: Skin is warm.  Neurological:     Mental Status: She is alert.     Neurovascular status intact muscle strength found to be adequate range of motion found to be within normal limits with what appears to be spurring of the first MPJ bilateral moderate diminishment of the motion with inflammation around the first MPJ left with fluid buildup mild on the right with reduced motion no crepitus and inflammation pain of the plantar heel left with fluid buildup around the joint surface.  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  Dispensed fascial brace to lift up the left arch  temporarily may require orthotics and placed on diclofenac twice daily  X-rays indicate spur formation around the first MPJ left and right slightly worse on the left with elevation of the first metatarsal segment and moderate changes to the joint surface with moderate bunion also noted left

## 2022-05-11 ENCOUNTER — Other Ambulatory Visit: Payer: Self-pay | Admitting: Podiatry

## 2022-05-11 DIAGNOSIS — M7752 Other enthesopathy of left foot: Secondary | ICD-10-CM

## 2022-05-11 DIAGNOSIS — M205X9 Other deformities of toe(s) (acquired), unspecified foot: Secondary | ICD-10-CM

## 2022-05-11 DIAGNOSIS — M722 Plantar fascial fibromatosis: Secondary | ICD-10-CM

## 2022-05-11 DIAGNOSIS — M79671 Pain in right foot: Secondary | ICD-10-CM

## 2022-05-11 DIAGNOSIS — M79672 Pain in left foot: Secondary | ICD-10-CM

## 2022-05-29 ENCOUNTER — Encounter: Payer: Self-pay | Admitting: Podiatry

## 2022-05-29 ENCOUNTER — Ambulatory Visit: Payer: BC Managed Care – PPO | Admitting: Podiatry

## 2022-05-29 DIAGNOSIS — M7672 Peroneal tendinitis, left leg: Secondary | ICD-10-CM

## 2022-05-29 DIAGNOSIS — M722 Plantar fascial fibromatosis: Secondary | ICD-10-CM

## 2022-05-29 MED ORDER — TRIAMCINOLONE ACETONIDE 10 MG/ML IJ SUSP
10.0000 mg | Freq: Once | INTRAMUSCULAR | Status: AC
  Administered 2022-05-29: 10 mg

## 2022-05-31 NOTE — Progress Notes (Signed)
Subjective:   Patient ID: Cassie Curling, PhD, female   DOB: 47 y.o.   MRN: [id number]   HPI Patient states still having a lot of pain but the pain has moved now into the lateral portion of the foot and into the heel.  States she is having trouble bearing weight well on her foot and feels like it is multiple areas that hurt now   ROS      Objective:  Physical Exam  Neurovascular status intact muscle strength was found to be adequate range of motion adequate with exquisite discomfort noted peroneal insertion left into the plantar surface and into the fascial band left across the entire band as it comes underneath the calcaneus.  Patient has good digital perfusion well-oriented x 3     Assessment:  Significant areas of pain still present with the lateral side of the foot now worse but still having pain medial     Plan:  H&P reviewed all conditions and at this point I have recommended ice therapy and I have recommended immobilization to take all stress off the plantar foot and she was measured and fitted properly for below the knee air fracture walker and I also went ahead did sterile prep and injected the peroneal fascia and plantarly 3 mg Dexasone Kenalog 5 mg Xylocaine

## 2022-06-01 ENCOUNTER — Telehealth: Payer: Self-pay | Admitting: *Deleted

## 2022-06-01 NOTE — Telephone Encounter (Signed)
Patient is calling because her boot given seems too small , rubbing against heel,would like a larger size, will come in tomorrow if possible to exchange out.

## 2022-06-03 ENCOUNTER — Telehealth: Payer: Self-pay

## 2022-06-03 NOTE — Telephone Encounter (Signed)
That is fine 

## 2022-06-03 NOTE — Telephone Encounter (Signed)
Patient came into the office and states she was given the wrong size CAM BOOT. States she was given a small and she wears a size 11. She brought the size small boot back and in exchange I gave her a Large boot.

## 2022-06-19 ENCOUNTER — Ambulatory Visit: Payer: BC Managed Care – PPO | Admitting: Podiatry

## 2022-06-22 ENCOUNTER — Ambulatory Visit: Payer: BC Managed Care – PPO | Admitting: Podiatry

## 2022-06-22 DIAGNOSIS — M7662 Achilles tendinitis, left leg: Secondary | ICD-10-CM

## 2022-06-22 DIAGNOSIS — M722 Plantar fascial fibromatosis: Secondary | ICD-10-CM | POA: Diagnosis not present

## 2022-06-22 DIAGNOSIS — M205X9 Other deformities of toe(s) (acquired), unspecified foot: Secondary | ICD-10-CM

## 2022-06-22 DIAGNOSIS — M7672 Peroneal tendinitis, left leg: Secondary | ICD-10-CM | POA: Diagnosis not present

## 2022-06-22 MED ORDER — DICLOFENAC SODIUM 75 MG PO TBEC: 75.0000 mg | DELAYED_RELEASE_TABLET | Freq: Two times a day (BID) | ORAL | 2 refills | Status: AC

## 2022-06-22 NOTE — Progress Notes (Signed)
Subjective:   Patient ID: Cassie Curling, PhD, female   DOB: 47 y.o.   MRN: [id number]   HPI Patient presents stating overall she is doing quite a bit better with her left foot but she still has some inflammation around the heel but more on the lateral side into the Achilles with the acute medial fascial pain peroneal pain quite improved with boot usage   ROS      Objective:  Physical Exam  Neurovascular status was found to be intact with patient found to have inflammation of the posterior heel left lateral side with minimal discomfort of the plantar fascia or the peroneal insertion     Assessment:  Overall doing well with improvement of acute fasciitis peroneal tendinitis left with mild local Achilles tendinitis left lateral side     Plan:  H&P reviewed I have recommended that she gradually reduce the utilization of the boot and utilize diclofenac cream on the lateral side of the foot and placed on Voltaren oral.  Patient will be seen back as symptoms indicate may require treatment of this but I am hoping he will get better on its own

## 2022-07-22 ENCOUNTER — Ambulatory Visit: Payer: BC Managed Care – PPO | Admitting: Podiatry

## 2022-10-15 ENCOUNTER — Ambulatory Visit (INDEPENDENT_AMBULATORY_CARE_PROVIDER_SITE_OTHER): Payer: BC Managed Care – PPO | Admitting: Adult Health

## 2022-10-15 ENCOUNTER — Encounter: Payer: Self-pay | Admitting: Adult Health

## 2022-10-15 VITALS — BP 110/80 | Wt 223.0 lb

## 2022-10-15 DIAGNOSIS — R0683 Snoring: Secondary | ICD-10-CM

## 2022-10-15 NOTE — Assessment & Plan Note (Signed)
Loud snoring, daytime sleepiness concerning for underlying sleep apnea.  Will set patient up for home sleep study.  Patient education given on sleep apnea. - discussed how weight can impact sleep and risk for sleep disordered breathing - discussed options to assist with weight loss: combination of diet modification, cardiovascular and strength training exercises   - had an extensive discussion regarding the adverse health consequences related to untreated sleep disordered breathing - specifically discussed the risks for hypertension, coronary artery disease, cardiac dysrhythmias, cerebrovascular disease, and diabetes - lifestyle modification discussed   - discussed how sleep disruption can increase risk of accidents, particularly when driving - safe driving practices were discussed   Plan  Patient Instructions  Set up home sleep study  Do not drive if sleepy  Work on healthy weight  Follow up in 6 weeks to discuss sleep study results and treatment  m

## 2022-10-15 NOTE — Progress Notes (Signed)
@Patient  ID: Cassie Curling, PhD, female    DOB: 06-Dec-1975, 47 y.o.   MRN: [id number]  Chief Complaint  Patient presents with   Consult    Referring provider: Maryagnes Amos  HPI: 47 year old female seen for sleep consult October 15, 2022 for snoring and daytime sleepiness.   TEST/EVENTS :   10/15/2022 Sleep consult  Presents for a sleep consult today for snoring and daytime sleepiness. Kindly referred by Dr Renae Gloss.  Patient says that family has been complaining of very loud snoring.  She falls asleep easily in the evening hours while watching TV.  Feels tired throughout the day.  Feels that she always needs more sleep.  Typically goes to bed around 1 AM.  Falls asleep immediately.  Gets up at 5:15 AM.  Has never had a sleep study before.  Does have a family history of sleep apnea with her father.  Patient's weight is down about 20 pounds over the last 2 years.  Current weight is at 229 pounds.  She has no symptoms suspicious for Exercise paralysis.  Does not take any sleep aids.  Has minimal caffeine intake.  Does not nap but does fall asleep while watching TV in the evening hours.  Epworth score is 12 out of 24.  Gets sleepy if she sits down to rest, watch TV if she is a passenger car and in the evening hours.  Medical history significant for fibromyalgia, asthma, chronic allergies  Social history patient is married.  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  She lives at home with her husband and children.  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  She is a never smoker.  No alcohol.  No drug use.  Family history positive for asthma and cancer.  Father has sleep apnea is on CPAP.   Allergies  Allergen Reactions   Penicillins Other (See Comments)    Childhood reaction. Has tolerated Amoxicillin     There is no immunization history on file for this patient.  Past Medical History:  Diagnosis Date   Abnormal nuclear stress test -  Intermediate Risk with possible Anterior Ischemia 12/21/2013   Asthma    no inhaler  last "attack" 1999   Bone spur of right foot 07/18/2013   Bunion 07/18/2013   Chest pain with moderate risk for cardiac etiology 12/11/2013   Fatigue 12/11/2013   GERD (gastroesophageal reflux disease)    Gestational diabetes mellitus    History of endometriosis    Pain in lower limb 07/18/2013   Right arm pain 01/08/2014   Uterine prolapse     Tobacco History: Social History   Tobacco Use  Smoking Status Never  Smokeless Tobacco Never   Counseling given: Not Answered   Outpatient Medications Prior to Visit  Medication Sig Dispense Refill   Cholecalciferol 125 MCG (5000 UT) TABS cholecalciferol (vitamin D3) 125 mcg (5,000 unit) tablet     cyanocobalamin (,VITAMIN B-12,) 1000 MCG/ML injection SMARTSIG:1 Milliliter(s) SUB-Q Once a Week     Vitamin D-Vitamin K (VITAMIN K2-VITAMIN D3 PO) Take 1 tablet by mouth daily.     albuterol (VENTOLIN HFA) 108 (90 Base) MCG/ACT inhaler Inhale into the lungs.     aspirin EC 81 MG tablet Take 1 tablet (81 mg total) by mouth daily. (Patient not taking: Reported on 05/02/2020) 90 tablet 0   diclofenac (VOLTAREN) 75 MG EC tablet Take 1 tablet (75 mg total) by mouth 2 (two) times daily.  50 tablet 2   diclofenac (VOLTAREN) 75 MG EC tablet Take 1 tablet (75 mg total) by mouth 2 (two) times daily. 50 tablet 2   famotidine (PEPCID) 20 MG tablet Take by mouth. (Patient not taking: Reported on 05/02/2020)     fluticasone (FLONASE) 50 MCG/ACT nasal spray Place into the nose.     gabapentin (NEURONTIN) 300 MG capsule gabapentin 300 mg capsule     HYDROcodone-acetaminophen (NORCO) 5-325 MG tablet Take 1-2 tablets by mouth every 6 (six) hours as needed. (Patient not taking: Reported on 05/02/2020) 15 tablet 0   ibuprofen (ADVIL,MOTRIN) 200 MG tablet Take 800 mg by mouth every 8 (eight) hours as needed. For pain     Ibuprofen-Famotidine 800-26.6 MG TABS Take 1 capsule by mouth 3  (three) times daily.  (Patient not taking: Reported on 05/02/2020)     ID NOW COVID-19 KIT TEST AS DIRECTED TODAY     L-Methylfolate-B6-B12 (FOLTANX PO) Take 1 tablet by mouth daily.     lidocaine (LIDODERM) 5 % APPLY 1 PATCH ON THE SKIN Q 12 H AROUND THE CLOCK UTD (Patient not taking: Reported on 05/02/2020)  0   LORazepam (ATIVAN) 1 MG tablet Take 1 mg by mouth at bedtime.      meloxicam (MOBIC) 15 MG tablet Take 1 tablet (15 mg total) by mouth daily. 30 tablet 0   nabumetone (RELAFEN) 750 MG tablet Take by mouth.     naproxen (NAPROSYN) 375 MG tablet Take 1 tablet (375 mg total) by mouth 2 (two) times daily. (Patient not taking: Reported on 05/02/2020) 60 tablet 1   oxyCODONE-acetaminophen (PERCOCET) 7.5-325 MG tablet Take 1 tablet by mouth every 6 (six) hours as needed for severe pain. (Patient not taking: Reported on 05/02/2020) 60 tablet 0   prednisoLONE acetate (PRED FORTE) 1 % ophthalmic suspension prednisolone acetate 1 % eye drops,suspension  SHAKE LIQUID AND INSTILL 1 DROP IN BOTH EYES TWICE DAILY     SAXENDA 18 MG/3ML SOPN Inject 1.8 mLs into the skin daily. (Patient not taking: Reported on 05/02/2020)  5   Vitamin D, Ergocalciferol, (DRISDOL) 50000 UNITS CAPS capsule Take 50,000 Units by mouth 2 (two) times a week.  (Patient not taking: Reported on 10/15/2022)     amitriptyline (ELAVIL) 10 MG tablet amitriptyline 10 mg tablet  TAKE 1 TABLET BY MOUTH AT BEDTIME (Patient not taking: Reported on 10/15/2022)     azithromycin (ZITHROMAX) 250 MG tablet  (Patient not taking: Reported on 05/02/2020)     chlorpheniramine-HYDROcodone (TUSSIONEX) 10-8 MG/5ML SUER  (Patient not taking: Reported on 10/15/2022)     predniSONE (STERAPRED UNI-PAK 21 TAB) 10 MG (21) TBPK tablet Take as directed on dose pack for 6 days. (Patient not taking: Reported on 10/15/2022)     senna-docusate (SENOKOT-S) 8.6-50 MG tablet Take by mouth. (Patient not taking: Reported on 10/15/2022)     sucralfate (CARAFATE) 1 g tablet Take  by mouth. (Patient not taking: Reported on 10/15/2022)     No facility-administered medications prior to visit.     Review of Systems:   Constitutional:   No  weight loss, night sweats,  Fevers, chills,  +fatigue, or  lassitude.  HEENT:   No headaches,  Difficulty swallowing,  Tooth/dental problems, or  Sore throat,                No sneezing, itching, ear ache, nasal congestion, post nasal drip,   CV:  No chest pain,  Orthopnea, PND, swelling in lower extremities, anasarca, dizziness, palpitations,  syncope.   GI  No heartburn, indigestion, abdominal pain, nausea, vomiting, diarrhea, change in bowel habits, loss of appetite, bloody stools.   Resp: No shortness of breath with exertion or at rest.  No excess mucus, no productive cough,  No non-productive cough,  No coughing up of blood.  No change in color of mucus.  No wheezing.  No chest wall deformity  Skin: no rash or lesions.  GU: no dysuria, change in color of urine, no urgency or frequency.  No flank pain, no hematuria   MS:  No joint pain or swelling.  No decreased range of motion.  No back pain.    Physical Exam  LMP 06/03/2011   GEN: A/Ox3; pleasant , NAD, well nourished    HEENT:  [place]/AT,   NOSE-clear, THROAT-clear, no lesions, no postnasal drip or exudate noted.  Class III-IV NP airway  NECK:  Supple w/ fair ROM; no JVD; normal carotid impulses w/o bruits; no thyromegaly or nodules palpated; no lymphadenopathy.    RESP  Clear  P & A; w/o, wheezes/ rales/ or rhonchi. no accessory muscle use, no dullness to percussion  CARD:  RRR, no m/r/g, no peripheral edema, pulses intact, no cyanosis or clubbing.  GI:   Soft & nt; nml bowel sounds; no organomegaly or masses detected.   Musco: Warm bil, no deformities or joint swelling noted.   Neuro: alert, no focal deficits noted.    Skin: Warm, no lesions or rashes    Lab Results:  CBC    Component Value Date/Time   WBC 9.5 12/21/2013 1528   RBC 4.27 12/21/2013  1528   HGB 12.3 12/21/2013 1528   HCT 37.8 12/21/2013 1528   PLT 242.0 12/21/2013 1528   MCV 88.6 12/21/2013 1528   MCH 29.2 11/08/2013 2030   MCHC 32.4 12/21/2013 1528   RDW 13.2 12/21/2013 1528   LYMPHSABS 2.3 12/21/2013 1528   MONOABS 0.7 12/21/2013 1528   EOSABS 0.2 12/21/2013 1528   BASOSABS 0.1 12/21/2013 1528    BMET    Component Value Date/Time   NA 140 12/21/2013 1528   K 3.9 12/21/2013 1528   CL 106 12/21/2013 1528   CO2 28 12/21/2013 1528   GLUCOSE 86 12/21/2013 1528   BUN 15 12/21/2013 1528   CREATININE 0.9 12/21/2013 1528   CALCIUM 8.7 12/21/2013 1528   GFRNONAA 79 (L) 11/08/2013 2030   GFRAA >90 11/08/2013 2030    BNP No results found for: "BNP"  ProBNP No results found for: "PROBNP"  Imaging: No results found.  Administration History     None           No data to display          No results found for: "NITRICOXIDE"      Assessment & Plan:   No problem-specific Assessment & Plan notes found for this encounter.     Rubye Oaks, NP 10/15/2022

## 2022-10-15 NOTE — Progress Notes (Signed)
Reviewed and agree with assessment/plan.   Coralyn Helling, MD [facility] Pulmonary/Critical Care 10/15/2022, 10:19 AM Pager:  [phone]

## 2022-10-15 NOTE — Patient Instructions (Addendum)
Set up for home sleep study  Do not drive if sleepy  Work on healthy weight  Follow up in 6 weeks to discuss sleep study results and treatment

## 2022-11-25 ENCOUNTER — Telehealth: Payer: Self-pay | Admitting: *Deleted

## 2022-11-25 NOTE — Telephone Encounter (Signed)
E-mail ent to [place] with [employer] to find out the status of her HST.  I was advised that they had attempted to reach her multiple times without success.  I let her know I would attempt to call her and send her a mychart message.  Requested a phone # to have the patient to call.

## 2022-11-26 ENCOUNTER — Ambulatory Visit: Payer: BC Managed Care – PPO | Admitting: Adult Health

## 2023-02-03 ENCOUNTER — Other Ambulatory Visit: Payer: Self-pay

## 2023-02-04 LAB — SURGICAL PATHOLOGY

## 2023-05-04 IMAGING — CR DG CHEST 2V
2 series · 2 of 2 positions shown · non-contrast
Comparison: Chest radiograph dated 04/21/2018.

CLINICAL DATA: Chest tightness, congestion.

EXAM:
CHEST - 2 VIEW

[w chest pa]
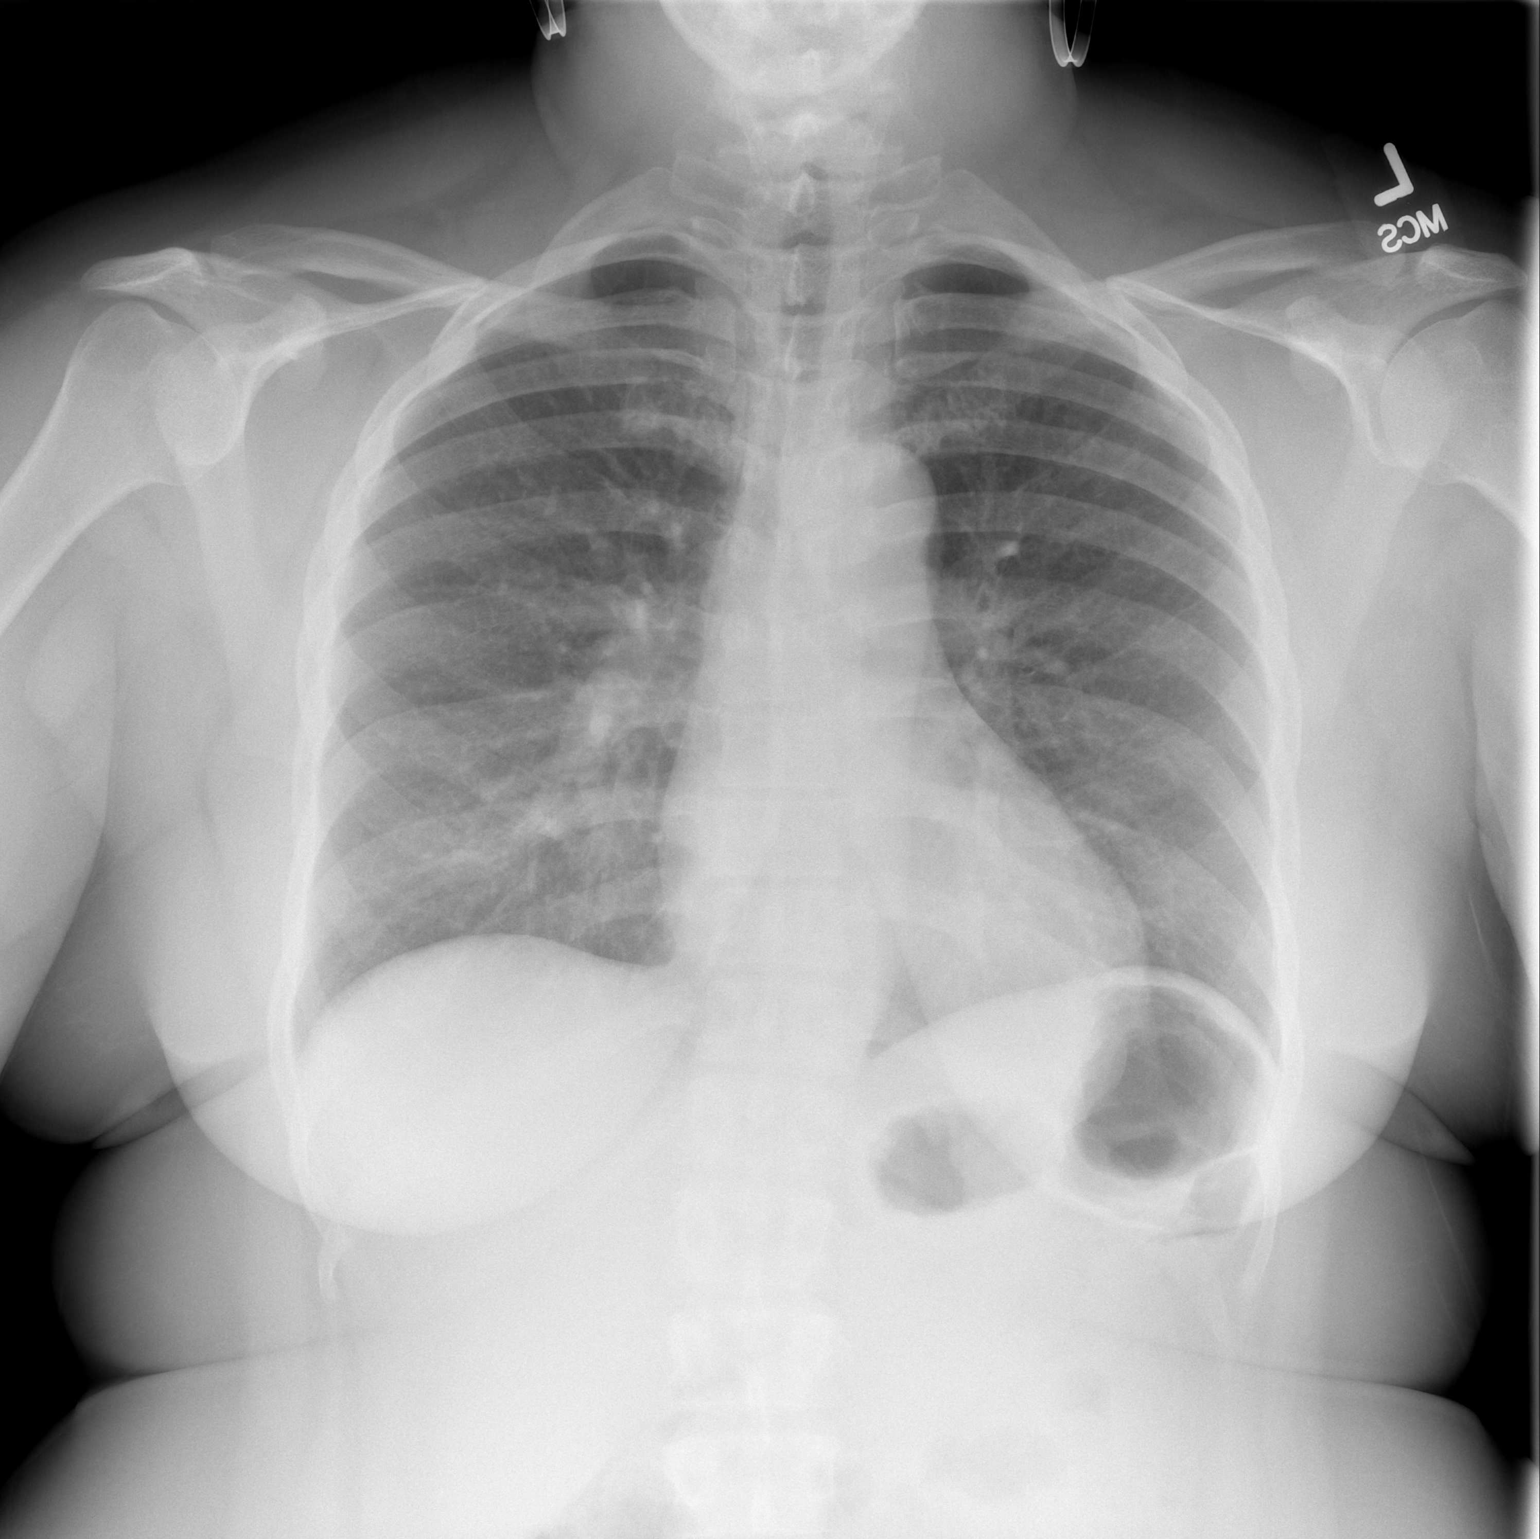

[w chest lat]
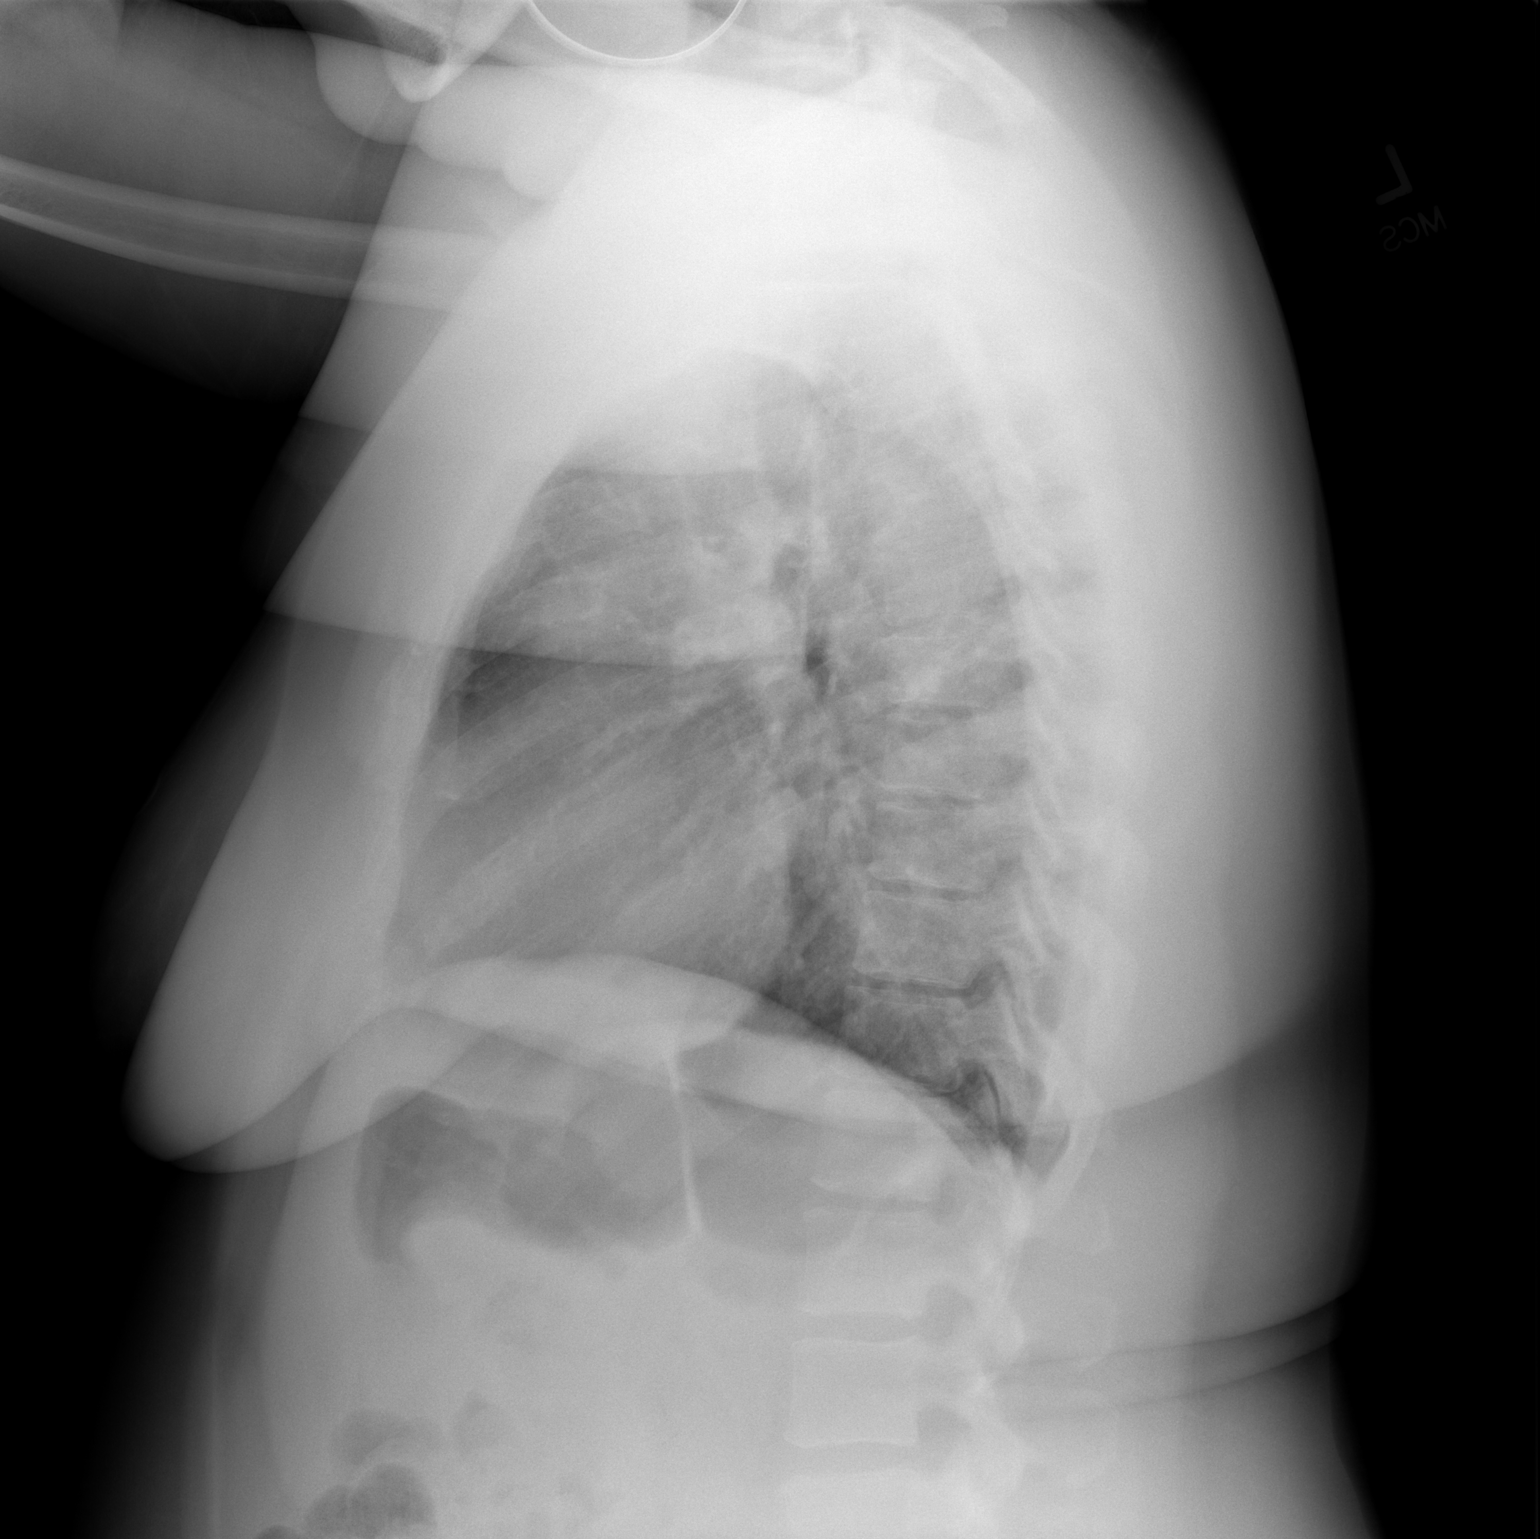

[2 of 2 positions shown; findings below may reference images not displayed]

FINDINGS: The heart size and mediastinal contours are within normal limits.
Both lungs are clear. The visualized skeletal structures are
unremarkable.
IMPRESSION: No active cardiopulmonary disease.
